# Patient Record
Sex: Male | Born: 1980
Health system: Southern US, Community
[De-identification: ages and names within clinical notes are randomized; demographics above are authoritative.]

## PROBLEM LIST (undated history)

## (undated) DIAGNOSIS — Z6838 Body mass index (BMI) 38.0-38.9, adult: Secondary | ICD-10-CM

## (undated) DIAGNOSIS — I69354 Hemiplegia and hemiparesis following cerebral infarction affecting left non-dominant side: Secondary | ICD-10-CM

## (undated) DIAGNOSIS — M1990 Unspecified osteoarthritis, unspecified site: Secondary | ICD-10-CM

## (undated) DIAGNOSIS — G4733 Obstructive sleep apnea (adult) (pediatric): Secondary | ICD-10-CM

## (undated) DIAGNOSIS — R3915 Urgency of urination: Secondary | ICD-10-CM

## (undated) DIAGNOSIS — G8194 Hemiplegia, unspecified affecting left nondominant side: Secondary | ICD-10-CM

## (undated) DIAGNOSIS — I6930 Unspecified sequelae of cerebral infarction: Secondary | ICD-10-CM

## (undated) DIAGNOSIS — I639 Cerebral infarction, unspecified: Secondary | ICD-10-CM

## (undated) DIAGNOSIS — Z8679 Personal history of other diseases of the circulatory system: Secondary | ICD-10-CM

## (undated) DIAGNOSIS — Z8669 Personal history of other diseases of the nervous system and sense organs: Secondary | ICD-10-CM

## (undated) DIAGNOSIS — I739 Peripheral vascular disease, unspecified: Secondary | ICD-10-CM

## (undated) HISTORY — PX: CIRCUMCISION: SUR203

## (undated) HISTORY — DX: Peripheral vascular disease, unspecified: I73.9

---

## 2014-01-06 ENCOUNTER — Emergency Department (INDEPENDENT_AMBULATORY_CARE_PROVIDER_SITE_OTHER)
Admission: EM | Admit: 2014-01-06 | Discharge: 2014-01-06 | Disposition: A | Discharge status: Home / Self Care | Payer: Medicaid Other | Source: Home / Self Care | Attending: Family Medicine | Admitting: Family Medicine

## 2014-01-06 DIAGNOSIS — K5289 Other specified noninfective gastroenteritis and colitis: Secondary | ICD-10-CM

## 2014-01-06 DIAGNOSIS — K529 Noninfective gastroenteritis and colitis, unspecified: Secondary | ICD-10-CM

## 2014-01-06 MED ORDER — CIPROFLOXACIN HCL 500 MG PO TABS: 500.0000 mg | ORAL_TABLET | Freq: Two times a day (BID) | ORAL | Status: DC

## 2014-01-06 NOTE — ED Notes (Signed)
C/o abd pain See physician note  

## 2014-01-06 NOTE — Discharge Instructions (Signed)
Thank you for coming in today. Take cipro twice daily for 3 days.  Come back as needed.  I will call you if the tests come back positive.  If your belly pain worsens, or you have high fever, bad vomiting, blood in your stool or black tarry stool go to the Emergency Room.   Diarrhea Diarrhea is frequent loose and watery bowel movements. It can cause you to feel weak and dehydrated. Dehydration can cause you to become tired and thirsty, have a dry mouth, and have decreased urination that often is dark yellow. Diarrhea is a sign of another problem, most often an infection that will not last long. In most cases, diarrhea typically lasts 2-3 days. However, it can last longer if it is a sign of something more serious. It is important to treat your diarrhea as directed by your caregiver to lessen or prevent future episodes of diarrhea. CAUSES  Some common causes include:  Gastrointestinal infections caused by viruses, bacteria, or parasites.  Food poisoning or food allergies.  Certain medicines, such as antibiotics, chemotherapy, and laxatives.  Artificial sweeteners and fructose.  Digestive disorders. HOME CARE INSTRUCTIONS  Ensure adequate fluid intake (hydration): Have 1 cup (8 oz) of fluid for each diarrhea episode. Avoid fluids that contain simple sugars or sports drinks, fruit juices, whole milk products, and sodas. Your urine should be clear or pale yellow if you are drinking enough fluids. Hydrate with an oral rehydration solution that you can purchase at pharmacies, retail stores, and online. You can prepare an oral rehydration solution at home by mixing the following ingredients together:   - tsp table salt.   tsp baking soda.   tsp salt substitute containing potassium chloride.  1  tablespoons sugar.  1 L (34 oz) of water.  Certain foods and beverages may increase the speed at which food moves through the gastrointestinal (GI) tract. These foods and beverages should be avoided  and include:  Caffeinated and alcoholic beverages.  High-fiber foods, such as raw fruits and vegetables, nuts, seeds, and whole grain breads and cereals.  Foods and beverages sweetened with sugar alcohols, such as xylitol, sorbitol, and mannitol.  Some foods may be well tolerated and may help thicken stool including:  Starchy foods, such as rice, toast, pasta, low-sugar cereal, oatmeal, grits, baked potatoes, crackers, and bagels.  Bananas.  Applesauce.  Add probiotic-rich foods to help increase healthy bacteria in the GI tract, such as yogurt and fermented milk products.  Wash your hands well after each diarrhea episode.  Only take over-the-counter or prescription medicines as directed by your caregiver.  Take a warm bath to relieve any burning or pain from frequent diarrhea episodes. SEEK IMMEDIATE MEDICAL CARE IF:   You are unable to keep fluids down.  You have persistent vomiting.  You have blood in your stool, or your stools are black and tarry.  You do not urinate in 6-8 hours, or there is only a small amount of very dark urine.  You have abdominal pain that increases or localizes.  You have weakness, dizziness, confusion, or light-headedness.  You have a severe headache.  Your diarrhea gets worse or does not get better.  You have a fever or persistent symptoms for more than 2-3 days.  You have a fever and your symptoms suddenly get worse. MAKE SURE YOU:   Understand these instructions.  Will watch your condition.  Will get help right away if you are not doing well or get worse. Document Released: 05/23/2002 Document  Revised: 10/17/2013 Document Reviewed: 02/08/2012 [facility] Patient Information 2015 [place], [place]. This information is not intended to replace advice given to you by your health care provider. Make sure you discuss any questions you have with your health care provider.

## 2014-01-06 NOTE — ED Provider Notes (Signed)
Travis Mullins is a 33 y.o. male who presents to Urgent Care today for diarrhea. Patient is a 40 history of diarrhea associated with mild abdominal pain. He initially had fevers and headaches and chills. Now his only remaining symptom is continued diarrhea. He denies any blood in the stool. He denies any vomiting. He notes that [place] to the onset of symptoms. He has tried Pepto-Bismol which helps some.  Medical history significant for carotid artery dissection with left hemiplegia resulting from a football injury in 2001 No past medical history on file. History  Substance Use Topics  . Smoking status: Not on file  . Smokeless tobacco: Not on file  . Alcohol Use: Not on file   ROS as above Medications: No current facility-administered medications for this encounter.   Current Outpatient Prescriptions  Medication Sig Dispense Refill  . ciprofloxacin (CIPRO) 500 MG tablet Take 1 tablet (500 mg total) by mouth 2 (two) times daily.  6 tablet  0    Exam:  BP 129/91  Pulse 98  Temp(Src) 99.7 F (37.6 C) (Oral)  Resp 16  SpO2 100% Gen: Well NAD HEENT: EOMI,  MMM Lungs: Normal work of breathing. CTABL Heart: RRR no MRG Abd: NABS, Soft. Nondistended, Nontender no rebound or guarding Exts: Brisk capillary refill, warm and well perfused.   No results found for this or any previous visit (from the past 24 hour(s)). No results found.  Assessment and Plan: 33 y.o. male with enteritis versus colitis. Stool culture and C. difficile PCR pending. Empiric treatment with Cipro. Will call patient with results.  Discussed warning signs or symptoms. Please see discharge instructions. Patient expresses understanding.   This note was created using Conservation officer, historic buildingsDragon voice recognition software. Any transcription errors are unintended.    Travis BongEvan S Doshie Maggi, MD 01/06/14 2023

## 2014-01-07 LAB — CLOSTRIDIUM DIFFICILE BY PCR: Toxigenic C. Difficile by PCR: NEGATIVE

## 2014-01-13 ENCOUNTER — Telehealth (HOSPITAL_COMMUNITY): Payer: Self-pay | Admitting: *Deleted

## 2014-01-13 LAB — STOOL CULTURE: Special Requests: NORMAL

## 2014-01-13 NOTE — ED Notes (Signed)
Stool culture: Campylobacter species.  [facility].  She already has a form.  I told her the treatment with Cipro by Dr. Denyse Amassorey and that I was going to f/u with the pt.  She will add that to pt.'s DHHS form.  Dr. Denyse Amassorey had already reviewed and said treatment with Cipro adequate.  I called pt. Pt. verified x 2 and given result.  Pt. said he is better and no more diarrhea. I told him the [facility] would be calling him next week. Vassie MoselleYork, Melissaann Dizdarevic M 01/13/2014

## 2014-08-08 ENCOUNTER — Encounter (HOSPITAL_COMMUNITY): Payer: Self-pay | Admitting: *Deleted

## 2014-08-08 ENCOUNTER — Emergency Department (HOSPITAL_COMMUNITY)
Admission: EM | Admit: 2014-08-08 | Discharge: 2014-08-08 | Disposition: A | Discharge status: Home / Self Care | Payer: Medicaid Other | Source: Home / Self Care | Attending: Family Medicine | Admitting: Family Medicine

## 2014-08-08 DIAGNOSIS — J069 Acute upper respiratory infection, unspecified: Secondary | ICD-10-CM

## 2014-08-08 NOTE — ED Provider Notes (Signed)
CSN: [id number]     Arrival date & time 08/08/14  1346 History   First MD Initiated Contact with Patient 08/08/14 1450     Chief Complaint  Patient presents with  . URI   (Consider location/radiation/quality/duration/timing/severity/associated sxs/prior Treatment) HPI Comments: 34 year old male complaining of cold symptoms.  Patient is a 34 y.o. male presenting with URI.  URI Presenting symptoms: congestion, cough and rhinorrhea   Presenting symptoms: no ear pain, no fatigue and no sore throat   Associated symptoms: no neck pain     History reviewed. No pertinent past medical history. History reviewed. No pertinent past surgical history. History reviewed. No pertinent family history. History  Substance Use Topics  . Smoking status: Not on file  . Smokeless tobacco: Not on file  . Alcohol Use: No    Review of Systems  Constitutional: Positive for activity change. Negative for diaphoresis and fatigue.       Questionable fever patient did not take his temperature.  HENT: Positive for congestion, postnasal drip and rhinorrhea. Negative for ear pain, facial swelling, sore throat and trouble swallowing.   Eyes: Negative for pain, discharge and redness.  Respiratory: Positive for cough. Negative for chest tightness and shortness of breath.   Cardiovascular: Negative.   Gastrointestinal: Negative.   Musculoskeletal: Negative.  Negative for neck pain and neck stiffness.  Neurological: Negative.     Allergies  Review of patient's allergies indicates no known allergies.  Home Medications   Prior to Admission medications   Medication Sig Start Date End Date Taking? Authorizing Provider  ciprofloxacin (CIPRO) 500 MG tablet Take 1 tablet (500 mg total) by mouth 2 (two) times daily. 01/06/14   Rodolph BongEvan S Corey, MD   BP 149/85 mmHg  Pulse 94  Temp(Src) 99.2 F (37.3 C) (Oral)  Resp 20  SpO2 100% Physical Exam  Constitutional: He is oriented to person, place, and time. He appears  well-developed and well-nourished. No distress.  HENT:  Bilateral TMs normal Unable to visualize the oropharynx due to patient's tongue retraction and an inability to position it for visualization.  Eyes: Conjunctivae and EOM are normal.  Neck: Normal range of motion. Neck supple.  Cardiovascular: Normal rate, regular rhythm and normal heart sounds.   Pulmonary/Chest: Effort normal and breath sounds normal. No respiratory distress. He has no wheezes. He has no rales.  Musculoskeletal: Normal range of motion. He exhibits no edema.  Lymphadenopathy:    He has no cervical adenopathy.  Neurological: He is alert and oriented to person, place, and time.  Skin: Skin is warm and dry. No rash noted.  Psychiatric: He has a normal mood and affect.  Nursing note and vitals reviewed.   ED Course  Procedures (including critical care time) Labs Review Labs Reviewed - No data to display  Imaging Review No results found.   MDM   1. URI (upper respiratory infection)    Nyquil, or for nondrowsy use Dayquil with either Zyrec, Claritin or Allegra Nasal saline spray Ibuprofen Lots of fluids     Hayden Rasmussenavid Jeanine Caven, NP 08/08/14 1505

## 2014-08-08 NOTE — ED Notes (Signed)
Pt     Reports   Symptoms       Of  Cold  Congested      With       Symptoms    X   3  Days              Pt  Reports  The  Symptoms   Are  Not  Being  releived  By otc  meds   -  Pt  Sitting upright on  The  Exam table      Speaking  In  Complete  Sentances

## 2014-08-08 NOTE — Discharge Instructions (Signed)
Upper Respiratory Infection, Adult Nyquil, or for nondrowsy use Dayquil with either Zyrec, Claritin or Allegra Nasal saline spray Ibuprofen Lots of fluids An upper respiratory infection (URI) is also sometimes known as the common cold. The upper respiratory tract includes the nose, sinuses, throat, trachea, and bronchi. Bronchi are the airways leading to the lungs. Most people improve within 1 week, but symptoms can last up to 2 weeks. A residual cough may last even longer.  CAUSES Many different viruses can infect the tissues lining the upper respiratory tract. The tissues become irritated and inflamed and often become very moist. Mucus production is also common. A cold is contagious. You can easily spread the virus to others by oral contact. This includes kissing, sharing a glass, coughing, or sneezing. Touching your mouth or nose and then touching a surface, which is then touched by another person, can also spread the virus. SYMPTOMS  Symptoms typically develop 1 to 3 days after you come in contact with a cold virus. Symptoms vary from person to person. They may include:  Runny nose.  Sneezing.  Nasal congestion.  Sinus irritation.  Sore throat.  Loss of voice (laryngitis).  Cough.  Fatigue.  Muscle aches.  Loss of appetite.  Headache.  Low-grade fever. DIAGNOSIS  You might diagnose your own cold based on familiar symptoms, since most people get a cold 2 to 3 times a year. Your caregiver can confirm this based on your exam. Most importantly, your caregiver can check that your symptoms are not due to another disease such as strep throat, sinusitis, pneumonia, asthma, or epiglottitis. Blood tests, throat tests, and X-rays are not necessary to diagnose a common cold, but they may sometimes be helpful in excluding other more serious diseases. Your caregiver will decide if any further tests are required. RISKS AND COMPLICATIONS  You may be at risk for a more severe case of the  common cold if you smoke cigarettes, have chronic heart disease (such as heart failure) or lung disease (such as asthma), or if you have a weakened immune system. The very young and very old are also at risk for more serious infections. Bacterial sinusitis, middle ear infections, and bacterial pneumonia can complicate the common cold. The common cold can worsen asthma and chronic obstructive pulmonary disease (COPD). Sometimes, these complications can require emergency medical care and may be life-threatening. PREVENTION  The best way to protect against getting a cold is to practice good hygiene. Avoid oral or hand contact with people with cold symptoms. Wash your hands often if contact occurs. There is no clear evidence that vitamin C, vitamin E, echinacea, or exercise reduces the chance of developing a cold. However, it is always recommended to get plenty of rest and practice good nutrition. TREATMENT  Treatment is directed at relieving symptoms. There is no cure. Antibiotics are not effective, because the infection is caused by a virus, not by bacteria. Treatment may include:  Increased fluid intake. Sports drinks offer valuable electrolytes, sugars, and fluids.  Breathing heated mist or steam (vaporizer or shower).  Eating chicken soup or other clear broths, and maintaining good nutrition.  Getting plenty of rest.  Using gargles or lozenges for comfort.  Controlling fevers with ibuprofen or acetaminophen as directed by your caregiver.  Increasing usage of your inhaler if you have asthma. Zinc gel and zinc lozenges, taken in the first 24 hours of the common cold, can shorten the duration and lessen the severity of symptoms. Pain medicines may help with fever, muscle  aches, and throat pain. A variety of non-prescription medicines are available to treat congestion and runny nose. Your caregiver can make recommendations and may suggest nasal or lung inhalers for other symptoms.  HOME CARE  INSTRUCTIONS   Only take over-the-counter or prescription medicines for pain, discomfort, or fever as directed by your caregiver.  Use a warm mist humidifier or inhale steam from a shower to increase air moisture. This may keep secretions moist and make it easier to breathe.  Drink enough water and fluids to keep your urine clear or pale yellow.  Rest as needed.  Return to work when your temperature has returned to normal or as your caregiver advises. You may need to stay home longer to avoid infecting others. You can also use a face mask and careful hand washing to prevent spread of the virus. SEEK MEDICAL CARE IF:   After the first few days, you feel you are getting worse rather than better.  You need your caregiver's advice about medicines to control symptoms.  You develop chills, worsening shortness of breath, or brown or red sputum. These may be signs of pneumonia.  You develop yellow or brown nasal discharge or pain in the face, especially when you bend forward. These may be signs of sinusitis.  You develop a fever, swollen neck glands, pain with swallowing, or white areas in the back of your throat. These may be signs of strep throat. SEEK IMMEDIATE MEDICAL CARE IF:   You have a fever.  You develop severe or persistent headache, ear pain, sinus pain, or chest pain.  You develop wheezing, a prolonged cough, cough up blood, or have a change in your usual mucus (if you have chronic lung disease).  You develop sore muscles or a stiff neck. Document Released: 11/26/2000 Document Revised: 08/25/2011 Document Reviewed: 09/07/2013 [facility] Patient Information 2015 [place], [place]. This information is not intended to replace advice given to you by your health care provider. Make sure you discuss any questions you have with your health care provider.

## 2015-05-12 ENCOUNTER — Emergency Department (HOSPITAL_COMMUNITY)
Admission: EM | Admit: 2015-05-12 | Discharge: 2015-05-12 | Disposition: A | Discharge status: Home / Self Care | Payer: PRIVATE HEALTH INSURANCE | Source: Home / Self Care

## 2015-05-12 ENCOUNTER — Encounter (HOSPITAL_COMMUNITY): Payer: Self-pay | Admitting: *Deleted

## 2015-05-12 DIAGNOSIS — J069 Acute upper respiratory infection, unspecified: Secondary | ICD-10-CM

## 2015-05-12 MED ORDER — AMOXICILLIN 500 MG PO CAPS: 500.0000 mg | ORAL_CAPSULE | Freq: Three times a day (TID) | ORAL | Status: DC

## 2015-05-12 NOTE — Discharge Instructions (Signed)
Upper Respiratory Infection, Adult Most upper respiratory infections (URIs) are a viral infection of the air passages leading to the lungs. A URI affects the nose, throat, and upper air passages. The most common type of URI is nasopharyngitis and is typically referred to as "the common cold." URIs run their course and usually go away on their own. Most of the time, a URI does not require medical attention, but sometimes a bacterial infection in the upper airways can follow a viral infection. This is called a secondary infection. Sinus and middle ear infections are common types of secondary upper respiratory infections. Bacterial pneumonia can also complicate a URI. A URI can worsen asthma and chronic obstructive pulmonary disease (COPD). Sometimes, these complications can require emergency medical care and may be life threatening.  CAUSES Almost all URIs are caused by viruses. A virus is a type of germ and can spread from one person to another.  RISKS FACTORS You may be at risk for a URI if:   You smoke.   You have chronic heart or lung disease.  You have a weakened defense (immune) system.   You are very young or very old.   You have nasal allergies or asthma.  You work in crowded or poorly ventilated areas.  You work in health care facilities or schools. SIGNS AND SYMPTOMS  Symptoms typically develop 2-3 days after you come in contact with a cold virus. Most viral URIs last 7-10 days. However, viral URIs from the influenza virus (flu virus) can last 14-18 days and are typically more severe. Symptoms may include:   Runny or stuffy (congested) nose.   Sneezing.   Cough.   Sore throat.   Headache.   Fatigue.   Fever.   Loss of appetite.   Pain in your forehead, behind your eyes, and over your cheekbones (sinus pain).  Muscle aches.  DIAGNOSIS  Your health care provider may diagnose a URI by:  Physical exam.  Tests to check that your symptoms are not due to  another condition such as:  Strep throat.  Sinusitis.  Pneumonia.  Asthma. TREATMENT  A URI goes away on its own with time. It cannot be cured with medicines, but medicines may be prescribed or recommended to relieve symptoms. Medicines may help:  Reduce your fever.  Reduce your cough.  Relieve nasal congestion. HOME CARE INSTRUCTIONS   Take medicines only as directed by your health care provider.   Gargle warm saltwater or take cough drops to comfort your throat as directed by your health care provider.  Use a warm mist humidifier or inhale steam from a shower to increase air moisture. This may make it easier to breathe.  Drink enough fluid to keep your urine clear or pale yellow.   Eat soups and other clear broths and maintain good nutrition.   Rest as needed.   Return to work when your temperature has returned to normal or as your health care provider advises. You may need to stay home longer to avoid infecting others. You can also use a face mask and careful hand washing to prevent spread of the virus.  Increase the usage of your inhaler if you have asthma.   Do not use any tobacco products, including cigarettes, chewing tobacco, or electronic cigarettes. If you need help quitting, ask your health care provider. PREVENTION  The best way to protect yourself from getting a cold is to practice good hygiene.   Avoid oral or hand contact with people with cold   symptoms.   Wash your hands often if contact occurs.  There is no clear evidence that vitamin C, vitamin E, echinacea, or exercise reduces the chance of developing a cold. However, it is always recommended to get plenty of rest, exercise, and practice good nutrition.  SEEK MEDICAL CARE IF:   You are getting worse rather than better.   Your symptoms are not controlled by medicine.   You have chills.  You have worsening shortness of breath.  You have brown or red mucus.  You have yellow or brown nasal  discharge.  You have pain in your face, especially when you bend forward.  You have a fever.  You have swollen neck glands.  You have pain while swallowing.  You have white areas in the back of your throat. SEEK IMMEDIATE MEDICAL CARE IF:   You have severe or persistent:  Headache.  Ear pain.  Sinus pain.  Chest pain.  You have chronic lung disease and any of the following:  Wheezing.  Prolonged cough.  Coughing up blood.  A change in your usual mucus.  You have a stiff neck.  You have changes in your:  Vision.  Hearing.  Thinking.  Mood. MAKE SURE YOU:   Understand these instructions.  Will watch your condition.  Will get help right away if you are not doing well or get worse.   This information is not intended to replace advice given to you by your health care provider. Make sure you discuss any questions you have with your health care provider.   Document Released: 11/26/2000 Document Revised: 10/17/2014 Document Reviewed: 09/07/2013 Elsevier Interactive Patient Education 2016 [employer] Inc[employer]  

## 2015-05-12 NOTE — ED Notes (Signed)
Pt  Reports  Symptoms  Of  Cough   comngestion   With  Symptoms   X   6  Days     Pt also  Has  Some  Body  Aches    As   Well      He is  Sitting  Upright on the  Exam table  Table  Speaking  In  Complete  sentances

## 2015-05-13 NOTE — ED Provider Notes (Signed)
CSN: [id number]     Arrival date & time 05/12/15  1412 History   None    Chief Complaint  Patient presents with  . URI   (Consider location/radiation/quality/duration/timing/severity/associated sxs/prior Treatment) HPI History obtained from patient:   LOCATION:chest SEVERITY: DURATION:6 days CONTEXT:sudden onset QUALITY: MODIFYING FACTORS:otc meds without relief ASSOCIATED SYMPTOMS:body aches TIMING:constant OCCUPATION:  History reviewed. No pertinent past medical history. History reviewed. No pertinent past surgical history. History reviewed. No pertinent family history. Social History  Substance Use Topics  . Smoking status: None  . Smokeless tobacco: None  . Alcohol Use: No    Review of Systems ROS +'vecough  Denies: HEADACHE, NAUSEA, ABDOMINAL PAIN, CHEST PAIN, CONGESTION, DYSURIA, SHORTNESS OF BREATH  Allergies  Review of patient's allergies indicates no known allergies.  Home Medications   Prior to Admission medications   Medication Sig Start Date End Date Taking? Authorizing Provider  amoxicillin (AMOXIL) 500 MG capsule Take 1 capsule (500 mg total) by mouth 3 (three) times daily. 05/12/15   Tharon AquasFrank C Lasheka Kempner, PA  ciprofloxacin (CIPRO) 500 MG tablet Take 1 tablet (500 mg total) by mouth 2 (two) times daily. 01/06/14   Rodolph BongEvan S Corey, MD   Meds Ordered and Administered this Visit  Medications - No data to display  BP 124/77 mmHg  Pulse 79  Temp(Src) 98.2 F (36.8 C) (Oral)  Resp 18  SpO2 98% No data found.   Physical Exam  Constitutional: He is oriented to person, place, and time. He appears well-developed and well-nourished.  HENT:  Head: Normocephalic and atraumatic.  Eyes: Conjunctivae are normal.  Neck: Neck supple.  Cardiovascular: Normal rate, regular rhythm and normal heart sounds.   Pulmonary/Chest: Effort normal and breath sounds normal.  Abdominal: Soft. Bowel sounds are normal.  Musculoskeletal: Normal range of motion.  Lymphadenopathy:     He has no cervical adenopathy.  Neurological: He is alert and oriented to person, place, and time.  Skin: Skin is warm and dry.  Psychiatric: He has a normal mood and affect. His behavior is normal. Judgment and thought content normal.  Nursing note and vitals reviewed.   ED Course  Procedures (including critical care time)  Labs Review Labs Reviewed - No data to display  Imaging Review No results found.   Visual Acuity Review  Right Eye Distance:   Left Eye Distance:   Bilateral Distance:    Right Eye Near:   Left Eye Near:    Bilateral Near:         MDM   1. Acute URI    Amoxil and follow up if there are new or worsening of symptoms    Tharon AquasFrank C Domonique Cothran, GeorgiaPA 05/13/15 1222

## 2015-07-14 ENCOUNTER — Encounter (HOSPITAL_COMMUNITY): Payer: Self-pay | Admitting: *Deleted

## 2015-07-14 ENCOUNTER — Emergency Department (HOSPITAL_COMMUNITY)
Admission: EM | Admit: 2015-07-14 | Discharge: 2015-07-14 | Disposition: A | Discharge status: Home / Self Care | Payer: PRIVATE HEALTH INSURANCE | Source: Home / Self Care | Attending: Family Medicine | Admitting: Family Medicine

## 2015-07-14 DIAGNOSIS — J069 Acute upper respiratory infection, unspecified: Secondary | ICD-10-CM

## 2015-07-14 HISTORY — DX: Hemiplegia, unspecified affecting left nondominant side: G81.94

## 2015-07-14 HISTORY — DX: Cerebral infarction, unspecified: I63.9

## 2015-07-14 NOTE — ED Provider Notes (Signed)
CSN: [id number]     Arrival date & time 07/14/15  1841 History   First MD Initiated Contact with Patient 07/14/15 2009     Chief Complaint  Patient presents with  . Cough  . Nasal Congestion   (Consider location/radiation/quality/duration/timing/severity/associated sxs/prior Treatment) HPI Comments: 35 year old obese male complaining of sniffles, cough, sneezing, runny nose, upper respiratory congestion, PND for 8 days. He has taken several OTC medications without relief. Denies fever or chills.  Patient is a 35 y.o. male presenting with cough.  Cough Associated symptoms: rhinorrhea   Associated symptoms: no chills, no diaphoresis, no ear pain, no eye discharge, no fever, no shortness of breath and no sore throat     Past Medical History  Diagnosis Date  . Stroke (HCC)     R/T football injury - 2001  . Left hemiparesis (HCC)    History reviewed. No pertinent past surgical history. No family history on file. Social History  Substance Use Topics  . Smoking status: Never Smoker   . Smokeless tobacco: None  . Alcohol Use: Yes     Comment: occasional    Review of Systems  Constitutional: Positive for activity change. Negative for fever, chills, diaphoresis and fatigue.  HENT: Positive for congestion, postnasal drip and rhinorrhea. Negative for ear pain, facial swelling, sore throat and trouble swallowing.   Eyes: Negative for pain, discharge and redness.  Respiratory: Positive for cough. Negative for chest tightness and shortness of breath.   Cardiovascular: Negative.   Gastrointestinal: Negative.   Musculoskeletal: Negative.  Negative for neck pain and neck stiffness.  Neurological: Negative.     Allergies  Review of patient's allergies indicates no known allergies.  Home Medications   Prior to Admission medications   Medication Sig Start Date End Date Taking? Authorizing Provider  amoxicillin (AMOXIL) 500 MG capsule Take 1 capsule (500 mg total) by mouth 3 (three)  times daily. 05/12/15   Tharon Aquas, PA  ciprofloxacin (CIPRO) 500 MG tablet Take 1 tablet (500 mg total) by mouth 2 (two) times daily. 01/06/14   Rodolph Bong, MD   Meds Ordered and Administered this Visit  Medications - No data to display  BP 155/101 mmHg  Pulse 98  Temp(Src) 98.5 F (36.9 C) (Oral)  Resp 20  SpO2 96% No data found.   Physical Exam  Constitutional: He is oriented to person, place, and time. He appears well-developed and well-nourished. No distress.  HENT:  Bilateral TMs are normal. Unable to visualize oropharynx due to large thick tongue and patient's retraction of tongue  Eyes: Conjunctivae and EOM are normal.  Neck: Normal range of motion. Neck supple.  Cardiovascular: Normal rate, regular rhythm and normal heart sounds.   Pulmonary/Chest: Effort normal and breath sounds normal. No respiratory distress. He has no wheezes.  Musculoskeletal: Normal range of motion. He exhibits no edema.  Lymphadenopathy:    He has no cervical adenopathy.  Neurological: He is alert and oriented to person, place, and time.  Skin: Skin is warm and dry. No rash noted.  Psychiatric: He has a normal mood and affect.  Nursing note and vitals reviewed.   ED Course  Procedures (including critical care time)  Labs Review Labs Reviewed - No data to display  Imaging Review No results found.   Visual Acuity Review  Right Eye Distance:   Left Eye Distance:   Bilateral Distance:    Right Eye Near:   Left Eye Near:    Bilateral Near:  MDM   1. URI (upper respiratory infection)    Upper Respiratory Infection, Adult For head congestion use Sudafed PE 10 mg every 4 hours Saline nasal spray frequently For drainage use Claritin, Allegra or Zyrtec. If needed may use Chlor-Trimeton 4 mg every 4 hours for something stronger. This may cause drowsiness. Drink plenty of fluids stay well-hydrated Humidifier.    Hayden Rasmussen, NP 07/14/15 2036

## 2015-07-14 NOTE — Discharge Instructions (Signed)
Upper Respiratory Infection, Adult For head congestion use Sudafed PE 10 mg every 4 hours Saline nasal spray frequently For drainage use Claritin, Allegra or Zyrtec. If needed may use Chlor-Trimeton 4 mg every 4 hours for something stronger. This may cause drowsiness. Drink plenty of fluids stay well-hydrated Humidifier. Most upper respiratory infections (URIs) are a viral infection of the air passages leading to the lungs. A URI affects the nose, throat, and upper air passages. The most common type of URI is nasopharyngitis and is typically referred to as "the common cold." URIs run their course and usually go away on their own. Most of the time, a URI does not require medical attention, but sometimes a bacterial infection in the upper airways can follow a viral infection. This is called a secondary infection. Sinus and middle ear infections are common types of secondary upper respiratory infections. Bacterial pneumonia can also complicate a URI. A URI can worsen asthma and chronic obstructive pulmonary disease (COPD). Sometimes, these complications can require emergency medical care and may be life threatening.  CAUSES Almost all URIs are caused by viruses. A virus is a type of germ and can spread from one person to another.  RISKS FACTORS You may be at risk for a URI if:   You smoke.   You have chronic heart or lung disease.  You have a weakened defense (immune) system.   You are very young or very old.   You have nasal allergies or asthma.  You work in crowded or poorly ventilated areas.  You work in health care facilities or schools. SIGNS AND SYMPTOMS  Symptoms typically develop 2-3 days after you come in contact with a cold virus. Most viral URIs last 7-10 days. However, viral URIs from the influenza virus (flu virus) can last 14-18 days and are typically more severe. Symptoms may include:   Runny or stuffy (congested) nose.   Sneezing.   Cough.   Sore throat.    Headache.   Fatigue.   Fever.   Loss of appetite.   Pain in your forehead, behind your eyes, and over your cheekbones (sinus pain).  Muscle aches.  DIAGNOSIS  Your health care provider may diagnose a URI by:  Physical exam.  Tests to check that your symptoms are not due to another condition such as:  Strep throat.  Sinusitis.  Pneumonia.  Asthma. TREATMENT  A URI goes away on its own with time. It cannot be cured with medicines, but medicines may be prescribed or recommended to relieve symptoms. Medicines may help:  Reduce your fever.  Reduce your cough.  Relieve nasal congestion. HOME CARE INSTRUCTIONS   Take medicines only as directed by your health care provider.   Gargle warm saltwater or take cough drops to comfort your throat as directed by your health care provider.  Use a warm mist humidifier or inhale steam from a shower to increase air moisture. This may make it easier to breathe.  Drink enough fluid to keep your urine clear or pale yellow.   Eat soups and other clear broths and maintain good nutrition.   Rest as needed.   Return to work when your temperature has returned to normal or as your health care provider advises. You may need to stay home longer to avoid infecting others. You can also use a face mask and careful hand washing to prevent spread of the virus.  Increase the usage of your inhaler if you have asthma.   Do not use any tobacco  products, including cigarettes, chewing tobacco, or electronic cigarettes. If you need help quitting, ask your health care provider. PREVENTION  The best way to protect yourself from getting a cold is to practice good hygiene.   Avoid oral or hand contact with people with cold symptoms.   Wash your hands often if contact occurs.  There is no clear evidence that vitamin C, vitamin E, echinacea, or exercise reduces the chance of developing a cold. However, it is always recommended to get plenty  of rest, exercise, and practice good nutrition.  SEEK MEDICAL CARE IF:   You are getting worse rather than better.   Your symptoms are not controlled by medicine.   You have chills.  You have worsening shortness of breath.  You have brown or red mucus.  You have yellow or brown nasal discharge.  You have pain in your face, especially when you bend forward.  You have a fever.  You have swollen neck glands.  You have pain while swallowing.  You have white areas in the back of your throat. SEEK IMMEDIATE MEDICAL CARE IF:   You have severe or persistent:  Headache.  Ear pain.  Sinus pain.  Chest pain.  You have chronic lung disease and any of the following:  Wheezing.  Prolonged cough.  Coughing up blood.  A change in your usual mucus.  You have a stiff neck.  You have changes in your:  Vision.  Hearing.  Thinking.  Mood. MAKE SURE YOU:   Understand these instructions.  Will watch your condition.  Will get help right away if you are not doing well or get worse.   This information is not intended to replace advice given to you by your health care provider. Make sure you discuss any questions you have with your health care provider.   Document Released: 11/26/2000 Document Revised: 10/17/2014 Document Reviewed: 09/07/2013 Elsevier Interactive Patient Education [employer]

## 2015-07-14 NOTE — ED Notes (Signed)
C/O sneezing, productive cough, runny nose, nasal congestion x 8 days.  Has been taking Nyquil, Mucinex, chlorpheniramine without any relief.

## 2016-04-24 DIAGNOSIS — N50811 Right testicular pain: Secondary | ICD-10-CM | POA: Diagnosis not present

## 2016-05-21 DIAGNOSIS — G8194 Hemiplegia, unspecified affecting left nondominant side: Secondary | ICD-10-CM | POA: Diagnosis not present

## 2016-05-21 DIAGNOSIS — L608 Other nail disorders: Secondary | ICD-10-CM | POA: Diagnosis not present

## 2016-05-21 DIAGNOSIS — M24572 Contracture, left ankle: Secondary | ICD-10-CM | POA: Diagnosis not present

## 2016-05-21 DIAGNOSIS — B351 Tinea unguium: Secondary | ICD-10-CM | POA: Diagnosis not present

## 2016-05-21 DIAGNOSIS — M61272 Paralytic calcification and ossification of muscle, left ankle and foot: Secondary | ICD-10-CM | POA: Diagnosis not present

## 2016-06-23 DIAGNOSIS — M61272 Paralytic calcification and ossification of muscle, left ankle and foot: Secondary | ICD-10-CM | POA: Diagnosis not present

## 2016-06-27 DIAGNOSIS — N451 Epididymitis: Secondary | ICD-10-CM | POA: Diagnosis not present

## 2016-06-27 DIAGNOSIS — N50811 Right testicular pain: Secondary | ICD-10-CM | POA: Diagnosis not present

## 2016-07-15 DIAGNOSIS — N50811 Right testicular pain: Secondary | ICD-10-CM | POA: Diagnosis not present

## 2016-08-18 DIAGNOSIS — N50811 Right testicular pain: Secondary | ICD-10-CM | POA: Diagnosis not present

## 2016-09-26 DIAGNOSIS — E785 Hyperlipidemia, unspecified: Secondary | ICD-10-CM | POA: Diagnosis not present

## 2016-09-26 DIAGNOSIS — Z0000 Encounter for general adult medical examination without abnormal findings: Secondary | ICD-10-CM | POA: Diagnosis not present

## 2016-10-03 DIAGNOSIS — I639 Cerebral infarction, unspecified: Secondary | ICD-10-CM | POA: Diagnosis not present

## 2016-10-03 DIAGNOSIS — G4733 Obstructive sleep apnea (adult) (pediatric): Secondary | ICD-10-CM | POA: Diagnosis not present

## 2016-10-03 DIAGNOSIS — M21372 Foot drop, left foot: Secondary | ICD-10-CM | POA: Diagnosis not present

## 2016-10-03 DIAGNOSIS — Z0001 Encounter for general adult medical examination with abnormal findings: Secondary | ICD-10-CM | POA: Diagnosis not present

## 2016-10-08 DIAGNOSIS — L659 Nonscarring hair loss, unspecified: Secondary | ICD-10-CM | POA: Diagnosis not present

## 2016-11-16 ENCOUNTER — Encounter: Payer: Self-pay | Admitting: Neurology

## 2016-11-17 ENCOUNTER — Encounter (INDEPENDENT_AMBULATORY_CARE_PROVIDER_SITE_OTHER): Payer: Self-pay

## 2016-11-17 ENCOUNTER — Encounter: Payer: Self-pay | Admitting: Neurology

## 2016-11-17 ENCOUNTER — Ambulatory Visit (INDEPENDENT_AMBULATORY_CARE_PROVIDER_SITE_OTHER): Payer: Federal, State, Local not specified - PPO | Admitting: Neurology

## 2016-11-17 ENCOUNTER — Telehealth: Payer: Self-pay

## 2016-11-17 DIAGNOSIS — Z9989 Dependence on other enabling machines and devices: Secondary | ICD-10-CM | POA: Diagnosis not present

## 2016-11-17 DIAGNOSIS — E662 Morbid (severe) obesity with alveolar hypoventilation: Secondary | ICD-10-CM

## 2016-11-17 DIAGNOSIS — Z6841 Body Mass Index (BMI) 40.0 and over, adult: Secondary | ICD-10-CM

## 2016-11-17 DIAGNOSIS — Z8679 Personal history of other diseases of the circulatory system: Secondary | ICD-10-CM | POA: Diagnosis not present

## 2016-11-17 DIAGNOSIS — I69359 Hemiplegia and hemiparesis following cerebral infarction affecting unspecified side: Secondary | ICD-10-CM | POA: Diagnosis not present

## 2016-11-17 DIAGNOSIS — G4733 Obstructive sleep apnea (adult) (pediatric): Secondary | ICD-10-CM

## 2016-11-17 MED ORDER — BACLOFEN 10 MG PO TABS: 10.0000 mg | ORAL_TABLET | Freq: Every day | ORAL | 1 refills | Status: DC

## 2016-11-17 NOTE — Progress Notes (Addendum)
SLEEP MEDICINE CLINIC   Provider:  Melvyn Novas, M D  Primary Care Physician:  Pearson Grippe, MD   Referring Provider: Pearson Grippe, MD   Chief Complaint  Patient presents with  . New Patient (Initial Visit)    had sleep study 10 years ago, on cpap, needs supplies, needs DME    HPI:  Travis Mullins is a 35 y.o. male , seen here as in a referral from Dr. Selena Batten for a transfer of sleep care- patient has OSA and is on CPAP,  Travis Mullins is seen here today as a new patient. He reports having been diagnosed with obstructive sleep apnea in [facility]. He recalls being told that his apnea was moderately severe and he was placed on CPAP. He was diagnosed in 2008. By 2014 he had moved to [facility] and had no sleep follow-up care. His machine however is a newer model and yet is his first CPAP machine he ever owned. It is a ResMed S 9.He has used a nasal pillow since beginning CPAP therapy. I do not have access to his original sleep study-  but since it is almost 10 years ago I will repeat a split-night polysomnography, if his insurance permits. He is willing to give a release of medical information request for the [facility] sleep lab.   Chief complaint according to patient : none- the CPAP machine still seems to work, but he needs urgently new supplies.   Sleep habits are as follows: The patient's bedtime is between 10 and 11 PM, he describes his bedroom is cool, quiet and dark. He does not have preferred sleep position. He snores loudest when on his back and tries to avoid supine sleep. He will wake up with choking sensations short of air. He ends up on his back. He does report some orthopnea, feeling more at ease when seated which makes it easier to breathe. He has found himself sleeping in a seated position. Even with his residual left hemiparesis he can sleep on the left side without problem. While on CPAP he can sleep through the night, does not have nocturia and wakes up at 5 AM, usually rises  in the morning refreshed at about 5:15 AM. He estimates only 5-6 hours of sleep. He takes naps in daytime, lasting hours on week ends.   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 He has had no other symptoms of TIA, has recovered very well. He has a mild residual left-sided weakness is also affected the muscle mass his left arm is smaller than his right. He does have mild spasticity 2. He is right-hand dominant.  His BMI is 43 but blood pressure and heart rate for normal limits, Dr. Selena Batten ordered a comprehensive metabolic panel a lipid panel and a CBC.   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 He commutes to [employer], does not use alcohol, he has a very limited caffeine intake as well, does not exceed 2-3 caffeinated beverages a week. 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 He is not a shift worker per se. He works 8 AM to 4:30 PM at the [employer]. The patient lives alone.  Review of Systems: Out of a complete 14 system review, the  patient complains of only the following symptoms, and all other reviewed systems are negative. The patient often reports daytime fatigue and tiredness, in spite of being a compliant CPAP user. He knows that he does not snore while on CPAP.  Epworth score 12 , Fatigue severity score 59  , depression score n/a   Social History   Social History  . Marital status: Single    Spouse name: N/A  . Number of children: N/A  . Years of education: N/A   Occupational History  . Not on file.   Social History Main Topics  . Smoking status: Never Smoker  . Smokeless tobacco: Never Used  . Alcohol use Yes     Comment: occasional  . Drug use: No  . Sexual activity: Not on file    Other Topics Concern  . Not on file   Social History Narrative  . No narrative on file    Family History  Problem Relation Age of Onset  . Healthy Mother   . Healthy Father   . Lung cancer Maternal Grandfather     Past Medical History:  Diagnosis Date  . Left hemiparesis (HCC)   . Right-sided carotid artery disease (HCC)   . Stroke [facility])    R/T football injury - 2001    No past surgical history on file.  Current Outpatient Prescriptions  Medication Sig Dispense Refill  . amoxicillin (AMOXIL) 500 MG capsule Take 1 capsule (500 mg total) by mouth 3 (three) times daily. 21 capsule 0  . ciprofloxacin (CIPRO) 500 MG tablet Take 1 tablet (500 mg total) by mouth 2 (two) times daily. 6 tablet 0  . finasteride (PROSCAR) 5 MG tablet Take 0.25 tablets by mouth daily.    . Fluocinolone Acetonide Scalp 0.01 % OIL     . methocarbamol (ROBAXIN) 500 MG tablet Take 500 mg by mouth every 6 (six) hours as needed for muscle spasms.    . Omega-3 Fatty Acids (FISH OIL) 1000 MG CAPS Take 1,000 mg by mouth daily.    . simvastatin (ZOCOR) 10 MG tablet Take 10 mg by mouth daily.     No current facility-administered medications for this visit.     Allergies as of 11/17/2016  . (No Known Allergies)    Vitals: BP 129/84   Pulse 76   Resp 20   Ht 6' (1.829 m)   Wt (!) 310 lb (140.6 kg)   BMI 42.04 kg/m  Last Weight:  Wt Readings from Last 1 Encounters:  11/17/16 (!) 310 lb (140.6 kg)   WUJ:WJXB mass index is 42.04 kg/m.     Last Height:   Ht Readings from Last 1 Encounters:  11/17/16 6' (1.829 m)    Physical exam:  General: The patient is awake, alert and appears not in acute distress. The patient is well groomed. Head: Normocephalic, atraumatic. Neck is supple. Mallampati 5 - large tongue.  neck circumference: 18.5 . Nasal airflow patent , TMJ is not evident . Retrognathia is seen.  Cardiovascular:  Regular rate and rhythm , without  murmurs or carotid bruit, and without  distended neck veins. Respiratory: Lungs are clear to auscultation. Skin:  Without evidence of edema, or rash Trunk: BMI is 42 . The patient's posture is erect   Neurologic exam : The patient is awake and alert, oriented to place and time.  Attention span & concentration ability appears normal.   Cranial nerves: Pupils are equal and briskly reactive to light. Extraocular movements  in vertical and  horizontal planes intact and without nystagmus. Visual fields by finger perimetry are intact. Hearing to finger rub intact.  Facial sensation intact to fine touch. Facial motor strength is symmetric and tongue and uvula move midline. Shoulder shrug was symmetrical.   Motor exam:  Throughout the left body there is an elevation of tone, there has also been atrophy of some of the left-sided muscles, his left shoulder is lower, his biceps is smaller and his wrist and finger movements are more limited with spasticity. He has some hamstring weakness and some limited dorsiflexion of the left foot- high  Tone. He has had Botox hand treatments.   Sensory:  Fine touch, pinprick and vibration were tested in all extremities.  Proprioception tested in the upper extremities was normal.  Coordination: Rapid alternating movements in the fingers/hands was normal. Finger-to-nose maneuver  normal without evidence of ataxia, dysmetria or tremor. Gait and station: Patient walks without assistive device and is able unassisted to climb up to the exam table. Limp on the left  Turns with 3  Steps. Romberg testing is negative. Deep tendon reflexes: left sided brisk, spasticity.  Assessment:  After physical and neurologic examination, review of laboratory studies,  Personal review of imaging studies, reports of other /same  Imaging studies, results of polysomnography and / or neurophysiology testing and pre-existing records as far as provided in visit., my assessment is   1) I strongly suppose that Mr. Lonni Fixolan sleep apnea is  still present, but I would like to confirm the degree of apnea as well as the character of apnea. 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 They can also be an impact on his soft palate musculature and for this reason it would be helpful to undergo an attended sleep study that will tell us in which sleep position apnea is present. Until then I will ask him to use his current CPAP. I will be happy to prescribe an interface that he can use from now until his results are back. He used an AIRFIT P 10 with medium pillows.   2) hemi-spasticity, hemiparesis on the left.More concerned about his hand tone because he seems not to fully extend his fingers, and I am worried that he has limited movement of the thumb with contracture formation. I will refer him to my colleague Dr. Lucia GaskinsAhern for Botox evaluation. hsi limited use of the left hand makes CPAP cleaning and packing more difficult. He may need a CPAP cleaning device.  The patient reports that baclofen helped him at night, he could not tolerate it well in daytime due to drowsiness. Baclofen has not been used for the last 10 years, but other muscle relaxants have not given him relief from spasticity at night. We discussed in detail that baclofen at 10 mg taken at night should be safe as long as he is using a CPAP. My main concern was that it could increase obstructive sleep apnea and may be acting as a respiratory suppression agent.  3) at this time the patient presents with a body mass index over 40 and would be considered morbidly obese. Be happy to provide him some nutritional guidelines. He may also benefit from structured exercises. It has been very expensive for him to go through physical therapy so it may be easier to join the [employer] rarely and a Systems analystpersonal trainer  to establish a new  routine. We should consider the new CPAP machine to be auto titrator capably as his BMI may change significantly. He has not entertained surgical treatment options      The patient was advised of the nature of the OSA, hemispasticity, we discussed  Muscle relaxant therapy, Botox  -Therapy and nutritional counseling, CPAP use and cleaning to treat OSA  , the treatment options and the  risks for general health and wellness arising from not treating the condition.   I spent more than 45 minutes of face to face time with the patient.  Greater than 50% of time was spent in counseling and coordination of care. We have discussed the diagnosis and differential and I answered the patient's questions.    Plan:  Treatment plan and additional workup :  I will order a split night polysomnography with diagnostic and therapeutic part incorporated. My goal for the patient is to be re-evaluated for the degree of apnea, as his body mass index and body spasticity have changed over the last 10 years. He also has a very high degree Mallampati, large neck circumference and is considered morbidly obese all of these contribute to the risk factors of having obstructive sleep apnea and possibly at a higher degree than 10 years ago.  The patient reports having sleep related headache when he sleeps with CPAP; this is an indication for hypoventilation. I will make his CPAP titration a priority.  I thank Dr. Selena Batten for allowing me to participate in this pleasant gentleman's care. We will meet after the sleep study to discuss therapy. Usually a CPAP will be prescribed as soon as the results of a positive sleep study are in. I would choose a CPAP but can be used as an Production assistant, radio for this patient, who is now working more consistently to lose weight. As his BMI will change, his pressure needs will also.   Melvyn Novas, MD 11/17/2016, 11:53 AM  Certified in Neurology by ABPN Certified in Sleep Medicine by [facility]  [facility] [address] [place], [place] [address]

## 2016-11-17 NOTE — Addendum Note (Signed)
Addended by: Melvyn NovasHMEIER, Nakota Elsen on: 11/17/2016 12:29 PM   Modules accepted: Orders

## 2016-11-17 NOTE — Addendum Note (Signed)
Addended by: Melvyn NovasHMEIER, Nadra Hritz on: 11/17/2016 12:42 PM   Modules accepted: Orders

## 2016-11-17 NOTE — Patient Instructions (Addendum)
Sleep Studies A sleep study (polysomnogram) is a series of tests done while you are sleeping. It can show how well you sleep. This can help your health care provider diagnose a sleep disorder and show how severe your sleep disorder is. A sleep study may lead to treatment that will help you sleep better and prevent other medical problems caused by poor sleep. If you have a sleep disorder, you may also be at risk for:  Sleep-related accidents.  High blood pressure.  Heart disease.  Stroke.  Other medical conditions.  Sleep disorders are common. Your health care provider may suspect a sleep disorder if you:  Have loud snoring most nights.  Have brief periods when you stop breathing at night.  Feel sleepy on most days.  Fall asleep suddenly during the day.  Have trouble falling asleep or staying asleep.  Feel like you need to move your legs when trying to fall asleep.  Have dreams that seem very real shortly after falling asleep.  Feel like you cannot move when you first wake up.  Which tests will I need to have? Most sleep studies last all night and include these tests:  Recordings of your brain activity.  Recordings of your eye movements.  Recording of your heart rate and rhythm.  Blood pressure readings.  Readings of the amount of oxygen in your blood.  Measurements of your chest and belly movement as you breathe during sleep.  If you have signs of the sleep disorder called sleep apnea during your test, you may get a mask to wear for the second half of the night.  The mask provides continuous positive airway pressure (CPAP). This may improve sleep apnea significantly.  You will then have all tests done again with the mask in place to see if your measurements and recordings change.  How are sleep studies done? Most sleep studies are done over one full night of sleep.  You will arrive at the study center in the evening and can go home in the morning.  Bring your  pajamas and toothbrush.  Do not have caffeine on the day of your sleep study.  Your health care provider will let you know if you need to stop taking any of your regular medicines before the test.  To do the tests included in a polysomnogram, you will have:  Round, sticky patches with sensors attached to recording wires (electrodes) placed on your scalp, face, chest, and limbs.  Wires from all the electrodes and sensors run from your bed to a computer. The wires can be taken off and put back on if you need to get out of bed to go to the bathroom.  A sensor placed over your nose to measure airflow.  A finger clip put on one finger to measure your blood oxygen level.  A belt around your belly and a belt around your chest to measure breathing movements.  Where are sleep studies done? Sleep studies are done at sleep centers. A sleep center may be inside a hospital, office, or clinic. The room where you have the study may look like a hospital room or a hotel room. The health care providers doing the study may come in and out of the room during the study. Most of the time, they will be in another room monitoring your test. How is information from sleep studies helpful? A polysomnogram can be used along with your medical history and a physical exam to diagnose conditions, such as:  Sleep apnea.    Restless legs syndrome.  Sleep-related seizure disorders.  Sleep-related movement disorders.  A medical doctor who specializes in sleep will evaluate your sleep study. The specialist will share the results with your primary health care provider. Treatments based on your sleep study may include:  Improving your sleep habits (sleep hygiene).  Wearing a CPAP mask.  Wearing an oral device at night to improve breathing and reduce snoring.  Taking medicine for: ? Restless legs syndrome. ? Sleep-related seizure disorder. ? Sleep-related movement disorder.  This information is not intended to  replace advice given to you by your health care provider. Make sure you discuss any questions you have with your health care provider. Document Released: 12/07/2002 Document Revised: 01/27/2016 Document Reviewed: 08/08/2013 Elsevier Interactive Patient Education  2018 Elsevier Inc. Baclofen tablets What is this medicine? BACLOFEN (BAK loe fen) helps relieve spasms and cramping of muscles. It may be used to treat symptoms of multiple sclerosis or spinal cord injury. This medicine may be used for other purposes; ask your health care provider or pharmacist if you have questions. COMMON BRAND NAME(S): ED Baclofen, Lioresal What should I tell my health care provider before I take this medicine? They need to know if you have any of these conditions: -kidney disease -seizures -stroke -an unusual or allergic reaction to baclofen, other medicines, foods, dyes, or preservatives -pregnant or trying to get pregnant -breast-feeding How should I use this medicine? Take this medicine by mouth. Swallow it with a drink of water. Follow the directions on the prescription label. Do not take more medicine than you are told to take. Talk to your pediatrician regarding the use of this medicine in children. Special care may be needed. Overdosage: If you think you have taken too much of this medicine contact a poison control center or emergency room at once. NOTE: This medicine is only for you. Do not share this medicine with others. What if I miss a dose? If you miss a dose, take it as soon as you can. If it is almost time for your next dose, take only that dose. Do not take double or extra doses. What may interact with this medicine? Do not take this medication with any of the following medicines: -narcotic medicines for cough This medicine may also interact with the following medications: -alcohol -antihistamines for allergy, cough and cold -certain medicines for anxiety or sleep -certain medicines for  depression like amitriptyline, fluoxetine, sertraline -certain medicines for seizures like phenobarbital, primidone -general anesthetics like halothane, isoflurane, methoxyflurane, propofol -local anesthetics like lidocaine, pramoxine, tetracaine -medicines that relax muscles for surgery -narcotic medicines for pain -phenothiazines like chlorpromazine, mesoridazine, prochlorperazine, thioridazine This list may not describe all possible interactions. Give your health care provider a list of all the medicines, herbs, non-prescription drugs, or dietary supplements you use. Also tell them if you smoke, drink alcohol, or use illegal drugs. Some items may interact with your medicine. What should I watch for while using this medicine? Tell your doctor or health care professional if your symptoms do not start to get better or if they get worse. Do not suddenly stop taking your medicine. If you do, you may develop a severe reaction. If your doctor wants you to stop the medicine, the dose will be slowly lowered over time to avoid any side effects. Follow the advice of your doctor. You may get drowsy or dizzy. Do not drive, use machinery, or do anything that needs mental alertness until you know how this medicine affects you. Do not  stand or sit up quickly, especially if you are an older patient. This reduces the risk of dizzy or fainting spells. Alcohol may interfere with the effect of this medicine. Avoid alcoholic drinks. If you are taking another medicine that also causes drowsiness, you may have more side effects. Give your health care provider a list of all medicines you use. Your doctor will tell you how much medicine to take. Do not take more medicine than directed. Call emergency for help if you have problems breathing or unusual sleepiness. What side effects may I notice from receiving this medicine? Side effects that you should report to your doctor or health care professional as soon as  possible: -allergic reactions like skin rash, itching or hives, swelling of the face, lips, or tongue -breathing problems -changes in emotions or moods -changes in vision -chest pain -fast, irregular heartbeat -feeling faint or lightheaded, falls -hallucinations -loss of balance or coordination -ringing of the ears -seizures -trouble passing urine or change in the amount of urine -trouble walking -unusually weak or tired Side effects that usually do not require medical attention (report to your doctor or health care professional if they continue or are bothersome): -changes in taste -confusion -constipation -diarrhea -dry mouth -headache -muscle weakness -nausea, vomiting -trouble sleeping This list may not describe all possible side effects. Call your doctor for medical advice about side effects. You may report side effects to FDA at 1-800-FDA-1088. Where should I keep my medicine? Keep out of the reach of children. Store at room temperature between 15 and 30 degrees C (59 and 86 degrees F). Keep container tightly closed. Throw away any unused medicine after the expiration date. NOTE: This sheet is a summary. It may not cover all possible information. If you have questions about this medicine, talk to your doctor, pharmacist, or health care provider.  2018 Elsevier/Gold Standard (2015-03-12 15:56:23)

## 2016-11-17 NOTE — Telephone Encounter (Signed)
Since we do not have a sleep study on this pt available, I am not sure that a DME can offer pt supplies for his cpap. I have reached out to [facility] to find out.

## 2016-12-02 NOTE — Telephone Encounter (Signed)
I called pt to discuss. Pt has not scheduled his sleep study. Pt will need to complete this before he can get cpap supplies.  No answer, left a message asking him to call me back.

## 2016-12-02 NOTE — Telephone Encounter (Signed)
Received this notice from [facility]:  "I can not bill his insurance without a Sleep Study.  We may be able to provide him with some "DEMO" supplies while we wait for the Sleep study."

## 2016-12-08 NOTE — Telephone Encounter (Signed)
I called pt. I advised him that without a sleep study, we cannot get him new cpap supplies. Pt says that he will proceed with his sleep study as scheduled for Thursday. Pt verbalized understanding.

## 2016-12-11 ENCOUNTER — Ambulatory Visit (INDEPENDENT_AMBULATORY_CARE_PROVIDER_SITE_OTHER): Payer: Federal, State, Local not specified - PPO | Admitting: Neurology

## 2016-12-11 DIAGNOSIS — Z6841 Body Mass Index (BMI) 40.0 and over, adult: Secondary | ICD-10-CM

## 2016-12-11 DIAGNOSIS — G4733 Obstructive sleep apnea (adult) (pediatric): Secondary | ICD-10-CM | POA: Diagnosis not present

## 2016-12-11 DIAGNOSIS — I69359 Hemiplegia and hemiparesis following cerebral infarction affecting unspecified side: Secondary | ICD-10-CM

## 2016-12-11 DIAGNOSIS — Z9989 Dependence on other enabling machines and devices: Principal | ICD-10-CM

## 2016-12-11 DIAGNOSIS — E662 Morbid (severe) obesity with alveolar hypoventilation: Secondary | ICD-10-CM

## 2016-12-11 DIAGNOSIS — Z8679 Personal history of other diseases of the circulatory system: Secondary | ICD-10-CM

## 2016-12-12 DIAGNOSIS — L648 Other androgenic alopecia: Secondary | ICD-10-CM | POA: Diagnosis not present

## 2016-12-26 NOTE — Procedures (Signed)
PATIENT'S NAME:  Travis Mullins, Travis Mullins DOB:      December 06, 1980      MR#:    [id number]     DATE OF RECORDING: 12/11/2016 REFERRING M.D.:  Pearson Grippe Study Performed:  Split-Night Titration Study HISTORY:  Mr. Travis Mullins is a 36 y.o. male, seen here as in a referral from Dr. Selena Batten for a transfer of sleep care- patient has established diagnosis of OSA and is on CPAP since being diagnosed with obstructive sleep apnea in [place], [place] in 2008. He recalls being told that his apnea was moderately severe, he uses a  ResMed S 9.He has used a nasal pillow since beginning CPAP therapy. I do not have access to his original sleep study- but since it is almost 10 years ago I repeat a split-night polysomnography.  The patient endorsed the Epworth Sleepiness Scale at 12 points   The patient's weight 310 pounds with a height of 72 (inches), resulting in a BMI of 42.1 kg/m2. The patient's neck circumference measured 18.5 inches.  CURRENT MEDICATIONS: Amoxil, Cipro, Proscar, Robaxin, Fish Oil, Zocor   PROCEDURE:  This is a multichannel digital polysomnogram utilizing the SomnoStar 11.2 system.  Electrodes and sensors were applied and monitored per AASM Specifications.   EEG, EOG, Chin and Limb EMG, were sampled at 200 Hz.  ECG, Snore and Nasal Pressure, Thermal Airflow, Respiratory Effort, CPAP Flow and Pressure, Oximetry was sampled at 50 Hz. Digital video and audio were recorded.      BASELINE STUDY WITHOUT CPAP RESULTS:  Lights Out was at 21:07 and Lights On at 05:07.  Total recording time (TRT) was 253.5, with a total sleep time (TST) of 125.5 minutes.   The patient's sleep latency was 122.5 minutes.  REM latency was 71 minutes.  The sleep efficiency was 49.5 %.    SLEEP ARCHITECTURE: WASO (Wake after sleep onset) was 2 minutes, Stage N1 was 4 minutes, Stage N2 was 79.5 minutes, Stage N3 was 27.5 minutes and Stage R (REM sleep) was 14.5 minutes.  The percentages were Stage N1 3.2%, Stage N2 63.3%, Stage N3 21.9% and  Stage R (REM sleep) 11.6%.   RESPIRATORY ANALYSIS:  There were a total of 111 respiratory events:  32 obstructive apneas, 0 central apneas and 0 mixed apneas with a total of 32 apneas and an apnea index (AI) of 15.3. There were 79 hypopneas with a hypopnea index of 37.8. The patient also had 0 respiratory event related arousals (RERAs).  Snoring was noted.   The total APNEA/HYPOPNEA INDEX (AHI) was 53.1 /hour and the total RESPIRATORY DISTURBANCE INDEX was 53.1 /hour.  14 events occurred in REM sleep and 158 events in NREM. The REM AHI was 57.9, /hour versus a non-REM AHI of 52.4 /hour. The patient spent 271 minutes sleep time in the supine position 77 minutes in non-supine. The supine AHI was 65.3 /hour versus a non-supine AHI of 0.0 /hour.  OXYGEN SATURATION & C02:  The wake baseline 02 saturation was 98%, with the lowest being 82%. Time spent below 89% saturation equaled 3 minutes.  PERIODIC LIMB MOVEMENTS:   The patient had a total of 30 Periodic Limb Movements. The arousals were noted as: 12 were spontaneous, 6 were associated with PLMs, and 109 were associated with respiratory events. The Periodic Limb Movement (PLM) index was 14.3 /hour and the PLM Arousal index was 2.9 /hour Audio and video analysis did not show any abnormal or unusual movements, behaviors, phonations or vocalizations Snoring was noted EKG was in keeping  with normal sinus rhythm (NSR)  TITRATION STUDY WITH CPAP RESULTS:   CPAP was initiated at 6 cmH20 with heated humidity per AASM split night standards and pressure was advanced to 6/6 cmH20 because of hypopneas, apneas and desaturations.  At a PAP pressure of 6 cmH20, there was a reduction of the AHI to 0.0 /hour and SpO2 Nadir to 94%.  Total recording time (TRT) was 227 minutes, with a total sleep time (TST) of 222 minutes. The patient's sleep latency was 1.5 minutes. REM latency was 5 minutes.  The sleep efficiency was 97.8 %.    SLEEP ARCHITECTURE: Wake after sleep was  3.5 minutes, Stage N1 3.5 minutes, Stage N2 89 minutes, Stage N3 65 minutes and Stage R (REM sleep) 64.5 minutes. The percentages were: Stage N1 1.6%, Stage N2 40.1%, Stage N3 29.3% and Stage R (REM sleep) 29.1%.  The arousals were noted as: 21 were spontaneous, 0 were associated with PLMs, and 0 were associated with respiratory events.  RESPIRATORY ANALYSIS:  There were a total of 0 respiratory events. The patient also had 0 respiratory event related arousals (RERAs).    The total APNEA/HYPOPNEA INDEX (AHI) was 0.0 /hour .The patient spent 76% of total sleep time in the supine position. The supine AHI was 0.0 /hour, versus a non-supine AHI of 0.0/hour. PERIODIC LIMB MOVEMENTS:   The patient had a total of 0 Periodic Limb Movements.   OXYGEN SATURATION & C02:  The wake baseline 02 saturation was 96%, with the lowest being 94%. Time spent below 89% saturation equaled 0 minutes.  POLYSOMNOGRAPHY IMPRESSION :   1. Severe Obstructive Sleep Apnea(OSA)   RECOMMENDATIONS: This patient responded well to only 6 cm water CPAP and reached an AHI of 0.0, sleep efficiency of 98%. An Airfit P 10 with medium pillows was used.   I certify that I have reviewed the entire raw data recording prior to the issuance of this report in accordance with the Standards of Accreditation of the American Academy of Sleep Medicine (AASM)   Melvyn Novasarmen Troyce Gieske, M.D.  12-23-2016 Diplomat, American Board of Psychiatry and Neurology  Diplomat, American Board of Sleep Medicine Medical Director of  [facility]

## 2016-12-26 NOTE — Addendum Note (Signed)
Addended by: Melvyn NovasHMEIER, Rhylan Gross on: 12/26/2016 12:01 PM   Modules accepted: Orders

## 2016-12-29 ENCOUNTER — Telehealth: Payer: Self-pay | Admitting: Neurology

## 2016-12-29 NOTE — Telephone Encounter (Signed)
I called pt. I advised pt that Dr. Vickey Hugerohmeier reviewed their sleep study titration results and found that pt  Responded well to the CPAP at 6 cm water. Dr. Vickey Hugerohmeier recommends that pt does use CPAP at night. I reviewed PAP compliance expectations with the pt. Pt is agreeable to starting a CPAP. I advised pt that an order will be sent to a DME, [facility], and [facility] will call the pt within about one week after they file with the pt's insurance. [facility] will show the pt how to use the machine, fit for masks, and troubleshoot the CPAP if needed. A follow up appt was made for insurance purposes with Dr. Marylou Flesherohmier on Monday Apr 13 2017 at 3:30pm. Pt verbalized understanding to arrive 15 minutes early and bring their CPAP. A letter with all of this information in it will be mailed to the pt as a reminder. I verified with the pt that the address we have on file is correct. Pt verbalized understanding of results. Pt had no questions at this time but was encouraged to call back if questions arise.

## 2017-02-26 DIAGNOSIS — G4733 Obstructive sleep apnea (adult) (pediatric): Secondary | ICD-10-CM | POA: Diagnosis not present

## 2017-03-28 DIAGNOSIS — G4733 Obstructive sleep apnea (adult) (pediatric): Secondary | ICD-10-CM | POA: Diagnosis not present

## 2017-04-13 ENCOUNTER — Telehealth: Payer: Self-pay | Admitting: Neurology

## 2017-04-13 ENCOUNTER — Ambulatory Visit: Payer: Self-pay | Admitting: Neurology

## 2017-04-13 NOTE — Telephone Encounter (Signed)
Pt no showed to apt today 

## 2017-04-14 ENCOUNTER — Encounter: Payer: Self-pay | Admitting: Neurology

## 2017-04-28 DIAGNOSIS — G4733 Obstructive sleep apnea (adult) (pediatric): Secondary | ICD-10-CM | POA: Diagnosis not present

## 2017-05-28 DIAGNOSIS — G4733 Obstructive sleep apnea (adult) (pediatric): Secondary | ICD-10-CM | POA: Diagnosis not present

## 2017-06-15 DIAGNOSIS — E785 Hyperlipidemia, unspecified: Secondary | ICD-10-CM | POA: Diagnosis not present

## 2017-06-15 DIAGNOSIS — I639 Cerebral infarction, unspecified: Secondary | ICD-10-CM | POA: Diagnosis not present

## 2017-07-09 DIAGNOSIS — N451 Epididymitis: Secondary | ICD-10-CM | POA: Diagnosis not present

## 2017-07-09 DIAGNOSIS — I7771 Dissection of carotid artery: Secondary | ICD-10-CM | POA: Diagnosis not present

## 2017-07-09 DIAGNOSIS — I639 Cerebral infarction, unspecified: Secondary | ICD-10-CM | POA: Diagnosis not present

## 2017-07-16 DIAGNOSIS — Z8679 Personal history of other diseases of the circulatory system: Secondary | ICD-10-CM | POA: Diagnosis not present

## 2017-07-16 DIAGNOSIS — Z8673 Personal history of transient ischemic attack (TIA), and cerebral infarction without residual deficits: Secondary | ICD-10-CM | POA: Diagnosis not present

## 2017-07-16 DIAGNOSIS — I639 Cerebral infarction, unspecified: Secondary | ICD-10-CM | POA: Diagnosis not present

## 2017-07-20 DIAGNOSIS — G4733 Obstructive sleep apnea (adult) (pediatric): Secondary | ICD-10-CM | POA: Diagnosis not present

## 2017-08-03 DIAGNOSIS — M659 Synovitis and tenosynovitis, unspecified: Secondary | ICD-10-CM | POA: Diagnosis not present

## 2017-08-03 DIAGNOSIS — M76822 Posterior tibial tendinitis, left leg: Secondary | ICD-10-CM | POA: Diagnosis not present

## 2017-08-03 DIAGNOSIS — M7752 Other enthesopathy of left foot: Secondary | ICD-10-CM | POA: Diagnosis not present

## 2017-08-03 DIAGNOSIS — M19072 Primary osteoarthritis, left ankle and foot: Secondary | ICD-10-CM | POA: Diagnosis not present

## 2017-08-10 DIAGNOSIS — M7752 Other enthesopathy of left foot: Secondary | ICD-10-CM | POA: Diagnosis not present

## 2017-08-10 DIAGNOSIS — M76821 Posterior tibial tendinitis, right leg: Secondary | ICD-10-CM | POA: Diagnosis not present

## 2017-08-10 DIAGNOSIS — M76822 Posterior tibial tendinitis, left leg: Secondary | ICD-10-CM | POA: Diagnosis not present

## 2017-08-24 DIAGNOSIS — M659 Synovitis and tenosynovitis, unspecified: Secondary | ICD-10-CM | POA: Diagnosis not present

## 2017-08-24 DIAGNOSIS — M7752 Other enthesopathy of left foot: Secondary | ICD-10-CM | POA: Diagnosis not present

## 2017-08-27 DIAGNOSIS — N3281 Overactive bladder: Secondary | ICD-10-CM | POA: Diagnosis not present

## 2017-08-27 DIAGNOSIS — M5412 Radiculopathy, cervical region: Secondary | ICD-10-CM | POA: Diagnosis not present

## 2017-08-27 DIAGNOSIS — G4733 Obstructive sleep apnea (adult) (pediatric): Secondary | ICD-10-CM | POA: Diagnosis not present

## 2017-08-27 DIAGNOSIS — R252 Cramp and spasm: Secondary | ICD-10-CM | POA: Diagnosis not present

## 2017-08-27 DIAGNOSIS — G5603 Carpal tunnel syndrome, bilateral upper limbs: Secondary | ICD-10-CM | POA: Diagnosis not present

## 2017-08-27 DIAGNOSIS — I639 Cerebral infarction, unspecified: Secondary | ICD-10-CM | POA: Diagnosis not present

## 2017-09-10 DIAGNOSIS — Z9989 Dependence on other enabling machines and devices: Secondary | ICD-10-CM | POA: Diagnosis not present

## 2017-09-10 DIAGNOSIS — G4733 Obstructive sleep apnea (adult) (pediatric): Secondary | ICD-10-CM | POA: Diagnosis not present

## 2017-09-25 DIAGNOSIS — L600 Ingrowing nail: Secondary | ICD-10-CM | POA: Diagnosis not present

## 2017-10-20 DIAGNOSIS — M2042 Other hammer toe(s) (acquired), left foot: Secondary | ICD-10-CM | POA: Diagnosis not present

## 2017-10-27 DIAGNOSIS — M2042 Other hammer toe(s) (acquired), left foot: Secondary | ICD-10-CM | POA: Diagnosis not present

## 2017-12-28 DIAGNOSIS — M542 Cervicalgia: Secondary | ICD-10-CM | POA: Diagnosis not present

## 2017-12-29 ENCOUNTER — Ambulatory Visit
Admission: RE | Admit: 2017-12-29 | Discharge: 2017-12-29 | Disposition: A | Discharge status: Home / Self Care | Payer: Federal, State, Local not specified - PPO | Source: Ambulatory Visit | Attending: Internal Medicine | Admitting: Internal Medicine

## 2017-12-29 ENCOUNTER — Other Ambulatory Visit: Payer: Self-pay | Admitting: Internal Medicine

## 2017-12-29 DIAGNOSIS — M542 Cervicalgia: Secondary | ICD-10-CM

## 2017-12-29 DIAGNOSIS — I6521 Occlusion and stenosis of right carotid artery: Secondary | ICD-10-CM | POA: Diagnosis not present

## 2017-12-29 MED ORDER — IOPAMIDOL (ISOVUE-370) INJECTION 76%
75.0000 mL | Freq: Once | INTRAVENOUS | Status: AC | PRN
  Administered 2017-12-29: 75 mL via INTRAVENOUS

## 2018-01-25 ENCOUNTER — Other Ambulatory Visit: Payer: Self-pay

## 2018-01-25 ENCOUNTER — Ambulatory Visit: Payer: Federal, State, Local not specified - PPO | Admitting: Surgery

## 2018-01-25 ENCOUNTER — Encounter: Payer: Self-pay | Admitting: Surgery

## 2018-01-25 VITALS — BP 136/89 | HR 98 | Resp 20 | Ht 72.0 in | Wt 331.4 lb

## 2018-01-25 DIAGNOSIS — M2042 Other hammer toe(s) (acquired), left foot: Secondary | ICD-10-CM | POA: Diagnosis not present

## 2018-01-25 DIAGNOSIS — Z8679 Personal history of other diseases of the circulatory system: Secondary | ICD-10-CM | POA: Diagnosis not present

## 2018-01-25 DIAGNOSIS — L600 Ingrowing nail: Secondary | ICD-10-CM | POA: Diagnosis not present

## 2018-01-25 NOTE — Progress Notes (Signed)
[facility]  Patient name: Travis Mullins MRN: [id number] DOB: 03/10/1981 Sex: male   REQUESTING PROVIDER:    Dr. Selena BattenKim   REASON FOR CONSULT:    Carotid dissection  HISTORY OF PRESENT ILLNESS:   Travis Mullins is a 37 y.o. male, who is referred today for evaluation of a chronic right carotid dissection.  Approximately 15 years ago the patient was playing middle linebacker in college football and suffered a freak head injury which caused a right carotid dissection and subsequent occlusion of his right carotid artery.  He developed left-sided hemiparesis.  Recently he has been having some discomfort in the right neck which led to a CT scan.  He is here today for further follow-up.  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  He is a non-smoker.  PAST MEDICAL HISTORY    Past Medical History:  Diagnosis Date  . Left hemiparesis (HCC)   . Right-sided carotid artery disease (HCC)   . Stroke [facility](HCC)    R/T football injury - 2001     FAMILY HISTORY   Family History  Problem Relation Age of Onset  . Healthy Mother   . Healthy Father   . Lung cancer Maternal Grandfather     SOCIAL HISTORY:   Social History   Socioeconomic History  . Marital status: Single    Spouse name: Not on file  . Number of children: Not on file  . Years of education: Not on file  . Highest education level: Not on file  Occupational History  . Not on file  Social Needs  . Financial resource strain: Not on file  . Food insecurity:    Worry: Not on file    Inability: Not on file  . Transportation needs:    Medical: Not on file    Non-medical: Not on file  Tobacco Use  . Smoking status: Never Smoker  . Smokeless tobacco: Never Used  Substance and Sexual Activity  . Alcohol use: Yes    Comment: occasional  . Drug use: No  . Sexual activity: Not on file  Lifestyle  . Physical activity:    Days per week: Not on file   Minutes per session: Not on file  . Stress: Not on file  Relationships  . Social connections:    Talks on phone: Not on file    Gets together: Not on file    Attends religious service: Not on file    Active member of club or organization: Not on file    Attends meetings of clubs or organizations: Not on file    Relationship status: Not on file  . Intimate partner violence:    Fear of current or ex partner: Not on file    Emotionally abused: Not on file    Physically abused: Not on file    Forced sexual activity: Not on file  Other Topics Concern  . Not on file  Social History Narrative  . Not on file    ALLERGIES:    No Known Allergies  CURRENT MEDICATIONS:    Current Outpatient Medications  Medication Sig Dispense Refill  . baclofen (LIORESAL) 10 MG tablet Take 1 tablet (10 mg total) by mouth at bedtime. 90 each 1  . Fluocinolone Acetonide Scalp 0.01 % OIL     . methocarbamol (ROBAXIN) 500 MG tablet Take 500 mg by mouth every 6 (six) hours as needed for muscle spasms.    Marland Kitchen. amoxicillin (AMOXIL) 500 MG  capsule Take 1 capsule (500 mg total) by mouth 3 (three) times daily. (Patient not taking: Reported on 01/25/2018) 21 capsule 0  . ciprofloxacin (CIPRO) 500 MG tablet Take 1 tablet (500 mg total) by mouth 2 (two) times daily. (Patient not taking: Reported on 01/25/2018) 6 tablet 0  . finasteride (PROSCAR) 5 MG tablet Take 0.25 tablets by mouth daily.    . Omega-3 Fatty Acids (FISH OIL) 1000 MG CAPS Take 1,000 mg by mouth daily.    . simvastatin (ZOCOR) 10 MG tablet Take 10 mg by mouth daily.     No current facility-administered medications for this visit.     REVIEW OF SYSTEMS:   [X]  denotes positive finding, [ ]  denotes negative finding Cardiac  Comments:  Chest pain or chest pressure:    Shortness of breath upon exertion:    Short of breath when lying flat:    Irregular heart rhythm:        Vascular    Pain in calf, thigh, or hip brought on by ambulation:    Pain in  feet at night that wakes you up from your sleep:     Blood clot in your veins:    Leg swelling:         Pulmonary    Oxygen at home:    Productive cough:     Wheezing:         Neurologic    Sudden weakness in arms or legs:  x   Sudden numbness in arms or legs:  x   Sudden onset of difficulty speaking or slurred speech:    Temporary loss of vision in one eye:     Problems with dizziness:         Gastrointestinal    Blood in stool:      Vomited blood:         Genitourinary    Burning when urinating:     Blood in urine:        Psychiatric    Major depression:         Hematologic    Bleeding problems:    Problems with blood clotting too easily:        Skin    Rashes or ulcers:        Constitutional    Fever or chills:     PHYSICAL EXAM:   Vitals:   01/25/18 1507  BP: 136/89  Pulse: 98  Resp: 20  SpO2: 96%  Weight: (!) 331 lb 6.4 oz (150.3 kg)  Height: 6' (1.829 m)    GENERAL: The patient is a well-nourished male, in no acute distress. The vital signs are documented above. CARDIAC: There is a regular rate and rhythm.  PULMONARY: Nonlabored respirations ABDOMEN: Soft and non-tender with normal pitched bowel sounds.  MUSCULOSKELETAL: There are no major deformities or cyanosis. NEUROLOGIC: Left-sided hemiparesis sKIN: There are no ulcers or rashes noted. PSYCHIATRIC: The patient has a normal affect.  STUDIES:   I have reviewed his CT angiogram of the head and neck with the following findings: Findings compatible with carotid dissection, likely chronic. There is smooth tapering of the right internal carotid artery with a tiny lumen patent to the skull base compatible with string sign.  Partial imaging of intracranial contents reveals chronic right MCA infarct with narrowing and irregularity of the right M1 segment and M2 segments.  I have reviewed the images with Dr. Grace IsaacWatts of interventional radiology.  From my interpretation this is a chronic problem  which  does appear to show a chronic occlusion.  ASSESSMENT and PLAN   Chronic right carotid dissection with occlusion of the right internal carotid artery at the skull base: I discussed with the patient that I feel this is a chronic problem with no new findings.  I would not recommend any changes to his medical regimen.  Although it is possible he could have a stump syndrome in the future, I feel that this is unlikely.  No immediate intervention is necessary.  He will follow-up on an as-needed basis.   Durene Cal, MD [facility] [phone] Pager [phone]

## 2018-02-23 DIAGNOSIS — R3915 Urgency of urination: Secondary | ICD-10-CM | POA: Diagnosis not present

## 2018-02-23 DIAGNOSIS — K921 Melena: Secondary | ICD-10-CM | POA: Diagnosis not present

## 2018-02-23 DIAGNOSIS — M542 Cervicalgia: Secondary | ICD-10-CM | POA: Diagnosis not present

## 2018-02-23 DIAGNOSIS — Z23 Encounter for immunization: Secondary | ICD-10-CM | POA: Diagnosis not present

## 2018-02-23 DIAGNOSIS — N528 Other male erectile dysfunction: Secondary | ICD-10-CM | POA: Diagnosis not present

## 2018-03-08 DIAGNOSIS — K625 Hemorrhage of anus and rectum: Secondary | ICD-10-CM | POA: Diagnosis not present

## 2018-03-08 DIAGNOSIS — K6289 Other specified diseases of anus and rectum: Secondary | ICD-10-CM | POA: Diagnosis not present

## 2018-03-15 ENCOUNTER — Ambulatory Visit (HOSPITAL_COMMUNITY)
Admission: EM | Admit: 2018-03-15 | Discharge: 2018-03-15 | Disposition: A | Discharge status: Home / Self Care | Payer: Federal, State, Local not specified - PPO | Attending: Family Medicine | Admitting: Family Medicine

## 2018-03-15 ENCOUNTER — Encounter (HOSPITAL_COMMUNITY): Payer: Self-pay | Admitting: Emergency Medicine

## 2018-03-15 DIAGNOSIS — H1131 Conjunctival hemorrhage, right eye: Secondary | ICD-10-CM | POA: Diagnosis not present

## 2018-03-15 NOTE — ED Provider Notes (Signed)
[facility]    CSN: [id number] Arrival date & time: 03/15/18  1945     History   Chief Complaint Chief Complaint  Patient presents with  . Eye Problem    HPI Travis Mullins is a 37 y.o. male. Pt woke this AM with red eye. Nonknown trauma, itch, etc  HPI  Past Medical History:  Diagnosis Date  . Left hemiparesis (HCC)   . Right-sided carotid artery disease (HCC)   . Stroke [facility])    R/T football injury - 2001    There are no active problems to display for this patient.   History reviewed. No pertinent surgical history.     Home Medications    Prior to Admission medications   Medication Sig Start Date End Date Taking? Authorizing Provider  amoxicillin (AMOXIL) 500 MG capsule Take 1 capsule (500 mg total) by mouth 3 (three) times daily. Patient not taking: Reported on 01/25/2018 05/12/15   Tharon Aquas, PA  baclofen (LIORESAL) 10 MG tablet Take 1 tablet (10 mg total) by mouth at bedtime. 11/17/16   Dohmeier, Porfirio Mylar, MD  ciprofloxacin (CIPRO) 500 MG tablet Take 1 tablet (500 mg total) by mouth 2 (two) times daily. Patient not taking: Reported on 01/25/2018 01/06/14   Rodolph Bong, MD  finasteride (PROSCAR) 5 MG tablet Take 0.25 tablets by mouth daily. 10/08/16   [provider]  Fluocinolone Acetonide Scalp 0.01 % OIL  10/08/16   [provider]  methocarbamol (ROBAXIN) 500 MG tablet Take 500 mg by mouth every 6 (six) hours as needed for muscle spasms.    [provider]  Omega-3 Fatty Acids (FISH OIL) 1000 MG CAPS Take 1,000 mg by mouth daily.    [provider]  simvastatin (ZOCOR) 10 MG tablet Take 10 mg by mouth daily.    [provider]    Family History Family History  Problem Relation Age of Onset  . Healthy Mother   . Healthy Father   . Lung cancer Maternal Grandfather     Social History Social History   Tobacco Use  . Smoking status: Never Smoker  . Smokeless tobacco: Never Used  Substance Use  Topics  . Alcohol use: Yes    Comment: occasional  . Drug use: No     Allergies   Patient has no known allergies.   Review of Systems Review of Systems  Constitutional: Negative for chills and fever.  HENT: Negative for ear pain and sore throat.   Eyes: Positive for redness. Negative for pain and visual disturbance.  Respiratory: Negative for cough and shortness of breath.   Cardiovascular: Negative for chest pain and palpitations.  Gastrointestinal: Negative for abdominal pain and vomiting.  Genitourinary: Negative for dysuria and hematuria.  Musculoskeletal: Negative for arthralgias and back pain.  Skin: Negative for color change and rash.  Neurological: Negative for seizures and syncope.  All other systems reviewed and are negative.    Physical Exam Triage Vital Signs ED Triage Vitals  Enc Vitals Group     BP 03/15/18 2005 132/85     Pulse Rate 03/15/18 2005 74     Resp 03/15/18 2005 16     Temp 03/15/18 2005 98.1 F (36.7 C)     Temp Source 03/15/18 2005 Oral     SpO2 03/15/18 2005 100 %     Weight --      Height --      Head Circumference --      Peak Flow --  Pain Score 03/15/18 2006 4     Pain Loc --      Pain Edu? --      Excl. in GC? --    No data found.  Updated Vital Signs BP 132/85   Pulse 74   Temp 98.1 F (36.7 C) (Oral)   Resp 16   SpO2 100%   Visual Acuity Right Eye Distance: 20/20 Left Eye Distance: 20/20 Bilateral Distance: 20/20  Right Eye Near:   Left Eye Near:    Bilateral Near:     R eye with redness inner , nasla portion c/w subconjunctival hemorrhage  UC Treatments / Results  Labs (all labs ordered are listed, but only abnormal results are displayed) Labs Reviewed - No data to display  EKG None  Radiology No results found.  Procedures Procedures (including critical care time)  Medications Ordered in UC Medications - No data to display  Initial Impression / Assessment and Plan / UC Course  I have reviewed  the triage vital signs and the nursing notes.  Pertinent labs & imaging results that were available during my care of the patient were reviewed by me and considered in my medical decision making (see chart for details).     R subconjunctival hemorrhage  Final Clinical Impressions(s) / UC Diagnoses   Final diagnoses:  None   Discharge Instructions   None    ED Prescriptions    None     Controlled Substance Prescriptions [place] Controlled Substance Registry consulted? No   Frederica Kuster, MD 03/15/18 2034

## 2018-03-15 NOTE — ED Triage Notes (Signed)
PT has redness in right eye that may be a burst blood vessel. No pain in eye. Slight headache on right side. No vision problems.   Previous CVA at 43 post football injury

## 2018-03-18 DIAGNOSIS — K921 Melena: Secondary | ICD-10-CM | POA: Diagnosis not present

## 2018-03-18 DIAGNOSIS — K6289 Other specified diseases of anus and rectum: Secondary | ICD-10-CM | POA: Diagnosis not present

## 2018-03-18 DIAGNOSIS — K625 Hemorrhage of anus and rectum: Secondary | ICD-10-CM | POA: Diagnosis not present

## 2018-04-13 DIAGNOSIS — I861 Scrotal varices: Secondary | ICD-10-CM | POA: Diagnosis not present

## 2018-04-13 DIAGNOSIS — N451 Epididymitis: Secondary | ICD-10-CM | POA: Diagnosis not present

## 2018-04-13 DIAGNOSIS — R3915 Urgency of urination: Secondary | ICD-10-CM | POA: Diagnosis not present

## 2018-04-20 DIAGNOSIS — G4733 Obstructive sleep apnea (adult) (pediatric): Secondary | ICD-10-CM | POA: Diagnosis not present

## 2018-05-17 DIAGNOSIS — L661 Lichen planopilaris: Secondary | ICD-10-CM | POA: Diagnosis not present

## 2018-05-17 DIAGNOSIS — L648 Other androgenic alopecia: Secondary | ICD-10-CM | POA: Diagnosis not present

## 2018-05-19 DIAGNOSIS — I639 Cerebral infarction, unspecified: Secondary | ICD-10-CM | POA: Diagnosis not present

## 2018-05-26 DIAGNOSIS — N528 Other male erectile dysfunction: Secondary | ICD-10-CM | POA: Diagnosis not present

## 2018-05-26 DIAGNOSIS — G4733 Obstructive sleep apnea (adult) (pediatric): Secondary | ICD-10-CM | POA: Diagnosis not present

## 2018-05-26 DIAGNOSIS — G8194 Hemiplegia, unspecified affecting left nondominant side: Secondary | ICD-10-CM | POA: Diagnosis not present

## 2018-05-26 DIAGNOSIS — I6930 Unspecified sequelae of cerebral infarction: Secondary | ICD-10-CM | POA: Diagnosis not present

## 2018-06-17 DIAGNOSIS — L668 Other cicatricial alopecia: Secondary | ICD-10-CM | POA: Diagnosis not present

## 2018-06-17 DIAGNOSIS — L661 Lichen planopilaris: Secondary | ICD-10-CM | POA: Diagnosis not present

## 2018-07-19 DIAGNOSIS — L661 Lichen planopilaris: Secondary | ICD-10-CM | POA: Diagnosis not present

## 2018-07-21 DIAGNOSIS — G4733 Obstructive sleep apnea (adult) (pediatric): Secondary | ICD-10-CM | POA: Diagnosis not present

## 2018-08-19 DIAGNOSIS — L661 Lichen planopilaris: Secondary | ICD-10-CM | POA: Diagnosis not present

## 2018-08-23 DIAGNOSIS — I639 Cerebral infarction, unspecified: Secondary | ICD-10-CM | POA: Diagnosis not present

## 2018-08-23 DIAGNOSIS — Z0000 Encounter for general adult medical examination without abnormal findings: Secondary | ICD-10-CM | POA: Diagnosis not present

## 2018-08-23 DIAGNOSIS — Z125 Encounter for screening for malignant neoplasm of prostate: Secondary | ICD-10-CM | POA: Diagnosis not present

## 2018-08-23 DIAGNOSIS — E785 Hyperlipidemia, unspecified: Secondary | ICD-10-CM | POA: Diagnosis not present

## 2018-08-30 DIAGNOSIS — Z0000 Encounter for general adult medical examination without abnormal findings: Secondary | ICD-10-CM | POA: Diagnosis not present

## 2018-12-01 DIAGNOSIS — G4733 Obstructive sleep apnea (adult) (pediatric): Secondary | ICD-10-CM | POA: Diagnosis not present

## 2018-12-06 DIAGNOSIS — R3915 Urgency of urination: Secondary | ICD-10-CM | POA: Diagnosis not present

## 2018-12-06 DIAGNOSIS — N451 Epididymitis: Secondary | ICD-10-CM | POA: Diagnosis not present

## 2018-12-06 DIAGNOSIS — F5221 Male erectile disorder: Secondary | ICD-10-CM | POA: Diagnosis not present

## 2018-12-06 DIAGNOSIS — I861 Scrotal varices: Secondary | ICD-10-CM | POA: Diagnosis not present

## 2018-12-07 DIAGNOSIS — L661 Lichen planopilaris: Secondary | ICD-10-CM | POA: Diagnosis not present

## 2018-12-22 DIAGNOSIS — Z3141 Encounter for fertility testing: Secondary | ICD-10-CM | POA: Diagnosis not present

## 2019-01-06 DIAGNOSIS — L661 Lichen planopilaris: Secondary | ICD-10-CM | POA: Diagnosis not present

## 2019-01-12 DIAGNOSIS — Z3141 Encounter for fertility testing: Secondary | ICD-10-CM | POA: Diagnosis not present

## 2019-01-14 DIAGNOSIS — Z7251 High risk heterosexual behavior: Secondary | ICD-10-CM | POA: Diagnosis not present

## 2019-02-01 DIAGNOSIS — L661 Lichen planopilaris: Secondary | ICD-10-CM | POA: Diagnosis not present

## 2019-02-01 DIAGNOSIS — L668 Other cicatricial alopecia: Secondary | ICD-10-CM | POA: Diagnosis not present

## 2019-03-04 DIAGNOSIS — G4733 Obstructive sleep apnea (adult) (pediatric): Secondary | ICD-10-CM | POA: Diagnosis not present

## 2019-03-11 DIAGNOSIS — B009 Herpesviral infection, unspecified: Secondary | ICD-10-CM | POA: Diagnosis not present

## 2019-03-12 DIAGNOSIS — R21 Rash and other nonspecific skin eruption: Secondary | ICD-10-CM | POA: Diagnosis not present

## 2019-03-16 DIAGNOSIS — R252 Cramp and spasm: Secondary | ICD-10-CM | POA: Diagnosis not present

## 2019-03-16 DIAGNOSIS — I6930 Unspecified sequelae of cerebral infarction: Secondary | ICD-10-CM | POA: Diagnosis not present

## 2019-03-16 DIAGNOSIS — N529 Male erectile dysfunction, unspecified: Secondary | ICD-10-CM | POA: Diagnosis not present

## 2019-03-16 DIAGNOSIS — G8194 Hemiplegia, unspecified affecting left nondominant side: Secondary | ICD-10-CM | POA: Diagnosis not present

## 2019-04-07 DIAGNOSIS — R21 Rash and other nonspecific skin eruption: Secondary | ICD-10-CM | POA: Diagnosis not present

## 2019-04-18 DIAGNOSIS — N471 Phimosis: Secondary | ICD-10-CM | POA: Diagnosis not present

## 2019-04-29 DIAGNOSIS — G4733 Obstructive sleep apnea (adult) (pediatric): Secondary | ICD-10-CM | POA: Diagnosis not present

## 2019-05-27 DIAGNOSIS — N471 Phimosis: Secondary | ICD-10-CM | POA: Diagnosis not present

## 2019-06-13 DIAGNOSIS — N471 Phimosis: Secondary | ICD-10-CM | POA: Diagnosis not present

## 2019-06-23 DIAGNOSIS — Z202 Contact with and (suspected) exposure to infections with a predominantly sexual mode of transmission: Secondary | ICD-10-CM | POA: Diagnosis not present

## 2019-07-06 DIAGNOSIS — R635 Abnormal weight gain: Secondary | ICD-10-CM | POA: Diagnosis not present

## 2019-07-14 DIAGNOSIS — Z79899 Other long term (current) drug therapy: Secondary | ICD-10-CM | POA: Diagnosis not present

## 2019-07-14 DIAGNOSIS — R635 Abnormal weight gain: Secondary | ICD-10-CM | POA: Diagnosis not present

## 2019-07-14 DIAGNOSIS — F54 Psychological and behavioral factors associated with disorders or diseases classified elsewhere: Secondary | ICD-10-CM | POA: Diagnosis not present

## 2019-07-14 DIAGNOSIS — G4733 Obstructive sleep apnea (adult) (pediatric): Secondary | ICD-10-CM | POA: Diagnosis not present

## 2019-07-14 DIAGNOSIS — Z6841 Body Mass Index (BMI) 40.0 and over, adult: Secondary | ICD-10-CM | POA: Diagnosis not present

## 2019-07-17 DIAGNOSIS — Z79899 Other long term (current) drug therapy: Secondary | ICD-10-CM | POA: Diagnosis not present

## 2019-07-25 DIAGNOSIS — R635 Abnormal weight gain: Secondary | ICD-10-CM | POA: Diagnosis not present

## 2019-07-25 DIAGNOSIS — E669 Obesity, unspecified: Secondary | ICD-10-CM | POA: Diagnosis not present

## 2019-07-25 DIAGNOSIS — K5900 Constipation, unspecified: Secondary | ICD-10-CM | POA: Diagnosis not present

## 2019-07-25 DIAGNOSIS — Z6841 Body Mass Index (BMI) 40.0 and over, adult: Secondary | ICD-10-CM | POA: Diagnosis not present

## 2019-07-25 DIAGNOSIS — G4733 Obstructive sleep apnea (adult) (pediatric): Secondary | ICD-10-CM | POA: Diagnosis not present

## 2019-07-26 DIAGNOSIS — Q558 Other specified congenital malformations of male genital organs: Secondary | ICD-10-CM | POA: Diagnosis not present

## 2019-08-01 DIAGNOSIS — G4733 Obstructive sleep apnea (adult) (pediatric): Secondary | ICD-10-CM | POA: Diagnosis not present

## 2019-08-08 DIAGNOSIS — F4323 Adjustment disorder with mixed anxiety and depressed mood: Secondary | ICD-10-CM | POA: Diagnosis not present

## 2019-08-10 ENCOUNTER — Other Ambulatory Visit: Payer: Self-pay | Admitting: Urology

## 2019-08-12 DIAGNOSIS — G4733 Obstructive sleep apnea (adult) (pediatric): Secondary | ICD-10-CM | POA: Diagnosis not present

## 2019-08-12 DIAGNOSIS — Z6839 Body mass index (BMI) 39.0-39.9, adult: Secondary | ICD-10-CM | POA: Diagnosis not present

## 2019-08-12 DIAGNOSIS — I6930 Unspecified sequelae of cerebral infarction: Secondary | ICD-10-CM | POA: Diagnosis not present

## 2019-08-12 DIAGNOSIS — E669 Obesity, unspecified: Secondary | ICD-10-CM | POA: Diagnosis not present

## 2019-08-15 DIAGNOSIS — F4323 Adjustment disorder with mixed anxiety and depressed mood: Secondary | ICD-10-CM | POA: Diagnosis not present

## 2019-08-16 DIAGNOSIS — Z6841 Body Mass Index (BMI) 40.0 and over, adult: Secondary | ICD-10-CM | POA: Diagnosis not present

## 2019-08-16 DIAGNOSIS — Z713 Dietary counseling and surveillance: Secondary | ICD-10-CM | POA: Diagnosis not present

## 2019-08-17 DIAGNOSIS — M545 Low back pain: Secondary | ICD-10-CM | POA: Diagnosis not present

## 2019-08-17 DIAGNOSIS — M9901 Segmental and somatic dysfunction of cervical region: Secondary | ICD-10-CM | POA: Diagnosis not present

## 2019-08-17 DIAGNOSIS — M62830 Muscle spasm of back: Secondary | ICD-10-CM | POA: Diagnosis not present

## 2019-08-17 DIAGNOSIS — M9903 Segmental and somatic dysfunction of lumbar region: Secondary | ICD-10-CM | POA: Diagnosis not present

## 2019-08-22 DIAGNOSIS — F4323 Adjustment disorder with mixed anxiety and depressed mood: Secondary | ICD-10-CM | POA: Diagnosis not present

## 2019-08-24 DIAGNOSIS — M545 Low back pain: Secondary | ICD-10-CM | POA: Diagnosis not present

## 2019-08-24 DIAGNOSIS — M9901 Segmental and somatic dysfunction of cervical region: Secondary | ICD-10-CM | POA: Diagnosis not present

## 2019-08-24 DIAGNOSIS — M9903 Segmental and somatic dysfunction of lumbar region: Secondary | ICD-10-CM | POA: Diagnosis not present

## 2019-08-24 DIAGNOSIS — M62830 Muscle spasm of back: Secondary | ICD-10-CM | POA: Diagnosis not present

## 2019-08-25 DIAGNOSIS — M545 Low back pain: Secondary | ICD-10-CM | POA: Diagnosis not present

## 2019-08-25 DIAGNOSIS — M9903 Segmental and somatic dysfunction of lumbar region: Secondary | ICD-10-CM | POA: Diagnosis not present

## 2019-08-25 DIAGNOSIS — M9901 Segmental and somatic dysfunction of cervical region: Secondary | ICD-10-CM | POA: Diagnosis not present

## 2019-08-25 DIAGNOSIS — M62830 Muscle spasm of back: Secondary | ICD-10-CM | POA: Diagnosis not present

## 2019-08-27 DIAGNOSIS — M545 Low back pain: Secondary | ICD-10-CM | POA: Diagnosis not present

## 2019-08-27 DIAGNOSIS — M9903 Segmental and somatic dysfunction of lumbar region: Secondary | ICD-10-CM | POA: Diagnosis not present

## 2019-08-27 DIAGNOSIS — M9901 Segmental and somatic dysfunction of cervical region: Secondary | ICD-10-CM | POA: Diagnosis not present

## 2019-08-27 DIAGNOSIS — M62830 Muscle spasm of back: Secondary | ICD-10-CM | POA: Diagnosis not present

## 2019-08-29 DIAGNOSIS — F4323 Adjustment disorder with mixed anxiety and depressed mood: Secondary | ICD-10-CM | POA: Diagnosis not present

## 2019-08-31 DIAGNOSIS — M62830 Muscle spasm of back: Secondary | ICD-10-CM | POA: Diagnosis not present

## 2019-08-31 DIAGNOSIS — M9901 Segmental and somatic dysfunction of cervical region: Secondary | ICD-10-CM | POA: Diagnosis not present

## 2019-08-31 DIAGNOSIS — M545 Low back pain: Secondary | ICD-10-CM | POA: Diagnosis not present

## 2019-08-31 DIAGNOSIS — M9903 Segmental and somatic dysfunction of lumbar region: Secondary | ICD-10-CM | POA: Diagnosis not present

## 2019-09-01 DIAGNOSIS — M9903 Segmental and somatic dysfunction of lumbar region: Secondary | ICD-10-CM | POA: Diagnosis not present

## 2019-09-01 DIAGNOSIS — M62830 Muscle spasm of back: Secondary | ICD-10-CM | POA: Diagnosis not present

## 2019-09-01 DIAGNOSIS — M545 Low back pain: Secondary | ICD-10-CM | POA: Diagnosis not present

## 2019-09-01 DIAGNOSIS — M9901 Segmental and somatic dysfunction of cervical region: Secondary | ICD-10-CM | POA: Diagnosis not present

## 2019-09-05 DIAGNOSIS — R739 Hyperglycemia, unspecified: Secondary | ICD-10-CM | POA: Diagnosis not present

## 2019-09-05 DIAGNOSIS — E785 Hyperlipidemia, unspecified: Secondary | ICD-10-CM | POA: Diagnosis not present

## 2019-09-05 DIAGNOSIS — Z125 Encounter for screening for malignant neoplasm of prostate: Secondary | ICD-10-CM | POA: Diagnosis not present

## 2019-09-05 DIAGNOSIS — Z0000 Encounter for general adult medical examination without abnormal findings: Secondary | ICD-10-CM | POA: Diagnosis not present

## 2019-09-05 DIAGNOSIS — Z713 Dietary counseling and surveillance: Secondary | ICD-10-CM | POA: Diagnosis not present

## 2019-09-05 DIAGNOSIS — E669 Obesity, unspecified: Secondary | ICD-10-CM | POA: Diagnosis not present

## 2019-09-07 DIAGNOSIS — Z6839 Body mass index (BMI) 39.0-39.9, adult: Secondary | ICD-10-CM | POA: Diagnosis not present

## 2019-09-07 DIAGNOSIS — Z713 Dietary counseling and surveillance: Secondary | ICD-10-CM | POA: Diagnosis not present

## 2019-09-07 DIAGNOSIS — F4323 Adjustment disorder with mixed anxiety and depressed mood: Secondary | ICD-10-CM | POA: Diagnosis not present

## 2019-09-07 DIAGNOSIS — I6930 Unspecified sequelae of cerebral infarction: Secondary | ICD-10-CM | POA: Diagnosis not present

## 2019-09-08 DIAGNOSIS — M62830 Muscle spasm of back: Secondary | ICD-10-CM | POA: Diagnosis not present

## 2019-09-08 DIAGNOSIS — M9901 Segmental and somatic dysfunction of cervical region: Secondary | ICD-10-CM | POA: Diagnosis not present

## 2019-09-08 DIAGNOSIS — M9903 Segmental and somatic dysfunction of lumbar region: Secondary | ICD-10-CM | POA: Diagnosis not present

## 2019-09-08 DIAGNOSIS — M545 Low back pain: Secondary | ICD-10-CM | POA: Diagnosis not present

## 2019-09-10 DIAGNOSIS — M9903 Segmental and somatic dysfunction of lumbar region: Secondary | ICD-10-CM | POA: Diagnosis not present

## 2019-09-10 DIAGNOSIS — M9901 Segmental and somatic dysfunction of cervical region: Secondary | ICD-10-CM | POA: Diagnosis not present

## 2019-09-10 DIAGNOSIS — M545 Low back pain: Secondary | ICD-10-CM | POA: Diagnosis not present

## 2019-09-10 DIAGNOSIS — M62830 Muscle spasm of back: Secondary | ICD-10-CM | POA: Diagnosis not present

## 2019-09-13 DIAGNOSIS — E669 Obesity, unspecified: Secondary | ICD-10-CM | POA: Diagnosis not present

## 2019-09-13 DIAGNOSIS — G4733 Obstructive sleep apnea (adult) (pediatric): Secondary | ICD-10-CM | POA: Diagnosis not present

## 2019-09-13 DIAGNOSIS — Z8673 Personal history of transient ischemic attack (TIA), and cerebral infarction without residual deficits: Secondary | ICD-10-CM | POA: Diagnosis not present

## 2019-09-14 DIAGNOSIS — R635 Abnormal weight gain: Secondary | ICD-10-CM | POA: Diagnosis not present

## 2019-09-14 DIAGNOSIS — M9901 Segmental and somatic dysfunction of cervical region: Secondary | ICD-10-CM | POA: Diagnosis not present

## 2019-09-14 DIAGNOSIS — Z0000 Encounter for general adult medical examination without abnormal findings: Secondary | ICD-10-CM | POA: Diagnosis not present

## 2019-09-14 DIAGNOSIS — M9903 Segmental and somatic dysfunction of lumbar region: Secondary | ICD-10-CM | POA: Diagnosis not present

## 2019-09-14 DIAGNOSIS — M62830 Muscle spasm of back: Secondary | ICD-10-CM | POA: Diagnosis not present

## 2019-09-14 DIAGNOSIS — M545 Low back pain: Secondary | ICD-10-CM | POA: Diagnosis not present

## 2019-09-15 DIAGNOSIS — M9901 Segmental and somatic dysfunction of cervical region: Secondary | ICD-10-CM | POA: Diagnosis not present

## 2019-09-15 DIAGNOSIS — M545 Low back pain: Secondary | ICD-10-CM | POA: Diagnosis not present

## 2019-09-15 DIAGNOSIS — M62830 Muscle spasm of back: Secondary | ICD-10-CM | POA: Diagnosis not present

## 2019-09-15 DIAGNOSIS — M9903 Segmental and somatic dysfunction of lumbar region: Secondary | ICD-10-CM | POA: Diagnosis not present

## 2019-09-21 DIAGNOSIS — M9903 Segmental and somatic dysfunction of lumbar region: Secondary | ICD-10-CM | POA: Diagnosis not present

## 2019-09-21 DIAGNOSIS — M62830 Muscle spasm of back: Secondary | ICD-10-CM | POA: Diagnosis not present

## 2019-09-21 DIAGNOSIS — M9901 Segmental and somatic dysfunction of cervical region: Secondary | ICD-10-CM | POA: Diagnosis not present

## 2019-09-21 DIAGNOSIS — M545 Low back pain: Secondary | ICD-10-CM | POA: Diagnosis not present

## 2019-09-22 ENCOUNTER — Other Ambulatory Visit: Payer: Self-pay

## 2019-09-22 ENCOUNTER — Encounter (HOSPITAL_BASED_OUTPATIENT_CLINIC_OR_DEPARTMENT_OTHER): Payer: Self-pay | Admitting: Urology

## 2019-09-22 DIAGNOSIS — M9901 Segmental and somatic dysfunction of cervical region: Secondary | ICD-10-CM | POA: Diagnosis not present

## 2019-09-22 DIAGNOSIS — M545 Low back pain: Secondary | ICD-10-CM | POA: Diagnosis not present

## 2019-09-22 DIAGNOSIS — M9903 Segmental and somatic dysfunction of lumbar region: Secondary | ICD-10-CM | POA: Diagnosis not present

## 2019-09-22 DIAGNOSIS — M62830 Muscle spasm of back: Secondary | ICD-10-CM | POA: Diagnosis not present

## 2019-09-22 NOTE — Progress Notes (Addendum)
ADDENDUM:  Chart reviewed by anesthesia, Jodell Cipro PA, ok to proceed.  Spoke w/ via phone for pre-op interview--- PT Lab needs dos----  no             Lab results------ no COVID test ------ 09-27-2019 @ 1450 Arrive at ------- 1000 NPO after ------ [place] Medications to take morning of surgery ----- Vesicare w/ sips of water Diabetic medication ----- n/a Patient Special Instructions ----- asked pt to bring cpap/ mask/ tubing with him dos Pre-Op special Istructions ----- n/a Patient verbalized understanding of instructions that were given at this phone interview. Patient denies shortness of breath, chest pain, fever, cough a this phone interview.   Anesthesia Review: hx  Tight ICA dissection in 2001 from football injury, CVA w/ left hemiparesis.  Chart to be reviewed by Jodell Cipro PA.  Pt denies stroke s&s.  PCP:  Dr Pearson Grippe Cardiologist : no Vascular:  Dr Myra Gianotti (lov 01-25-2018 epic, prn basis) WT management clinic @ [facility] Chest x-ray : no EKG :   07-17-2019 [facility] Carotid duplex:  07-16-2017 epic CT angio neck:  12-29-2017 epic Echo : no Stress test:  Nuclear 09-06-2009 care [facility] Cardiac Cath :  no Sleep Study/ CPAP :  YES/YES Fasting Blood Sugar :      / Checks Blood Sugar -- times a day:  N/A Blood Thinner/ Instructions Maurice Small Dose: NO ASA / Instructions/ Last Dose :  NO

## 2019-09-24 DIAGNOSIS — M9901 Segmental and somatic dysfunction of cervical region: Secondary | ICD-10-CM | POA: Diagnosis not present

## 2019-09-24 DIAGNOSIS — M545 Low back pain: Secondary | ICD-10-CM | POA: Diagnosis not present

## 2019-09-24 DIAGNOSIS — M62830 Muscle spasm of back: Secondary | ICD-10-CM | POA: Diagnosis not present

## 2019-09-24 DIAGNOSIS — M9903 Segmental and somatic dysfunction of lumbar region: Secondary | ICD-10-CM | POA: Diagnosis not present

## 2019-09-24 DIAGNOSIS — M62838 Other muscle spasm: Secondary | ICD-10-CM | POA: Diagnosis not present

## 2019-09-27 ENCOUNTER — Other Ambulatory Visit (HOSPITAL_COMMUNITY)
Admission: RE | Admit: 2019-09-27 | Discharge: 2019-09-27 | Disposition: A | Discharge status: Home / Self Care | Payer: Federal, State, Local not specified - PPO | Source: Ambulatory Visit | Attending: Urology | Admitting: Urology

## 2019-09-27 DIAGNOSIS — Z20822 Contact with and (suspected) exposure to covid-19: Secondary | ICD-10-CM | POA: Diagnosis not present

## 2019-09-27 DIAGNOSIS — Z01812 Encounter for preprocedural laboratory examination: Secondary | ICD-10-CM | POA: Diagnosis not present

## 2019-09-27 LAB — SARS CORONAVIRUS 2 (TAT 6-24 HRS): SARS Coronavirus 2: NEGATIVE

## 2019-09-29 DIAGNOSIS — Z713 Dietary counseling and surveillance: Secondary | ICD-10-CM | POA: Diagnosis not present

## 2019-09-29 DIAGNOSIS — G4733 Obstructive sleep apnea (adult) (pediatric): Secondary | ICD-10-CM | POA: Diagnosis not present

## 2019-09-29 DIAGNOSIS — E669 Obesity, unspecified: Secondary | ICD-10-CM | POA: Diagnosis not present

## 2019-09-29 DIAGNOSIS — Z6838 Body mass index (BMI) 38.0-38.9, adult: Secondary | ICD-10-CM | POA: Diagnosis not present

## 2019-09-30 ENCOUNTER — Ambulatory Visit (HOSPITAL_BASED_OUTPATIENT_CLINIC_OR_DEPARTMENT_OTHER): Payer: Federal, State, Local not specified - PPO | Admitting: Physician Assistant

## 2019-09-30 ENCOUNTER — Ambulatory Visit (HOSPITAL_BASED_OUTPATIENT_CLINIC_OR_DEPARTMENT_OTHER)
Admission: RE | Admit: 2019-09-30 | Discharge: 2019-09-30 | Disposition: A | Discharge status: Home / Self Care | Payer: Federal, State, Local not specified - PPO | Attending: Urology | Admitting: Urology

## 2019-09-30 ENCOUNTER — Encounter (HOSPITAL_BASED_OUTPATIENT_CLINIC_OR_DEPARTMENT_OTHER): Payer: Self-pay | Admitting: Urology

## 2019-09-30 ENCOUNTER — Encounter (HOSPITAL_BASED_OUTPATIENT_CLINIC_OR_DEPARTMENT_OTHER)
Admission: RE | Disposition: A | Discharge status: Home / Self Care | Payer: Self-pay | Source: Home / Self Care | Attending: Urology

## 2019-09-30 DIAGNOSIS — G4730 Sleep apnea, unspecified: Secondary | ICD-10-CM | POA: Insufficient documentation

## 2019-09-30 DIAGNOSIS — G4733 Obstructive sleep apnea (adult) (pediatric): Secondary | ICD-10-CM | POA: Diagnosis not present

## 2019-09-30 DIAGNOSIS — T8189XA Other complications of procedures, not elsewhere classified, initial encounter: Secondary | ICD-10-CM | POA: Diagnosis not present

## 2019-09-30 DIAGNOSIS — Y838 Other surgical procedures as the cause of abnormal reaction of the patient, or of later complication, without mention of misadventure at the time of the procedure: Secondary | ICD-10-CM | POA: Insufficient documentation

## 2019-09-30 DIAGNOSIS — Q5529 Other congenital malformations of testis and scrotum: Secondary | ICD-10-CM | POA: Diagnosis not present

## 2019-09-30 DIAGNOSIS — I69398 Other sequelae of cerebral infarction: Secondary | ICD-10-CM | POA: Insufficient documentation

## 2019-09-30 DIAGNOSIS — Z8679 Personal history of other diseases of the circulatory system: Secondary | ICD-10-CM | POA: Insufficient documentation

## 2019-09-30 DIAGNOSIS — Q542 Hypospadias, penoscrotal: Secondary | ICD-10-CM | POA: Diagnosis not present

## 2019-09-30 HISTORY — DX: Body mass index (BMI) 38.0-38.9, adult: Z68.38

## 2019-09-30 HISTORY — DX: Personal history of other diseases of the nervous system and sense organs: Z86.69

## 2019-09-30 HISTORY — DX: Urgency of urination: R39.15

## 2019-09-30 HISTORY — PX: HYDROCELE EXCISION: SHX482

## 2019-09-30 HISTORY — DX: Personal history of other diseases of the circulatory system: Z86.79

## 2019-09-30 HISTORY — DX: Unspecified osteoarthritis, unspecified site: M19.90

## 2019-09-30 HISTORY — DX: Obstructive sleep apnea (adult) (pediatric): G47.33

## 2019-09-30 HISTORY — DX: Unspecified sequelae of cerebral infarction: I69.30

## 2019-09-30 HISTORY — DX: Hemiplegia and hemiparesis following cerebral infarction affecting left non-dominant side: I69.354

## 2019-09-30 SURGERY — HYDROCELECTOMY
Anesthesia: General | Site: Scrotum

## 2019-09-30 MED ORDER — ACETAMINOPHEN 500 MG PO TABS
1000.0000 mg | ORAL_TABLET | Freq: Once | ORAL | Status: AC
  Administered 2019-09-30: 1000 mg via ORAL
  Filled 2019-09-30: qty 2

## 2019-09-30 MED ORDER — ONDANSETRON HCL 4 MG/2ML IJ SOLN
INTRAMUSCULAR | Status: DC | PRN
  Administered 2019-09-30: 4 mg via INTRAVENOUS

## 2019-09-30 MED ORDER — MIDAZOLAM HCL 5 MG/5ML IJ SOLN
INTRAMUSCULAR | Status: DC | PRN
  Administered 2019-09-30: 2 mg via INTRAVENOUS

## 2019-09-30 MED ORDER — DEXAMETHASONE SODIUM PHOSPHATE 10 MG/ML IJ SOLN
INTRAMUSCULAR | Status: DC | PRN
  Administered 2019-09-30: 10 mg via INTRAVENOUS

## 2019-09-30 MED ORDER — OXYCODONE HCL 5 MG PO TABS
5.0000 mg | ORAL_TABLET | Freq: Once | ORAL | Status: AC
  Administered 2019-09-30: 5 mg via ORAL
  Filled 2019-09-30: qty 1

## 2019-09-30 MED ORDER — TRAMADOL HCL 50 MG PO TABS: 50.0000 mg | ORAL_TABLET | Freq: Four times a day (QID) | ORAL | 0 refills | Status: AC | PRN

## 2019-09-30 MED ORDER — LACTATED RINGERS IV SOLN
INTRAVENOUS | Status: DC
  Filled 2019-09-30: qty 1000

## 2019-09-30 MED ORDER — ACETAMINOPHEN 500 MG PO TABS
ORAL_TABLET | ORAL | Status: AC
  Filled 2019-09-30: qty 2

## 2019-09-30 MED ORDER — ONDANSETRON HCL 4 MG/2ML IJ SOLN
INTRAMUSCULAR | Status: AC
  Filled 2019-09-30: qty 2

## 2019-09-30 MED ORDER — FENTANYL CITRATE (PF) 100 MCG/2ML IJ SOLN
INTRAMUSCULAR | Status: DC | PRN
  Administered 2019-09-30: 100 ug via INTRAVENOUS

## 2019-09-30 MED ORDER — DEXAMETHASONE SODIUM PHOSPHATE 10 MG/ML IJ SOLN
INTRAMUSCULAR | Status: AC
  Filled 2019-09-30: qty 1

## 2019-09-30 MED ORDER — LIDOCAINE 2% (20 MG/ML) 5 ML SYRINGE
INTRAMUSCULAR | Status: AC
  Filled 2019-09-30: qty 5

## 2019-09-30 MED ORDER — PROPOFOL 10 MG/ML IV BOLUS
INTRAVENOUS | Status: AC
  Filled 2019-09-30: qty 20

## 2019-09-30 MED ORDER — CEFAZOLIN SODIUM-DEXTROSE 2-4 GM/100ML-% IV SOLN
2.0000 g | INTRAVENOUS | Status: AC
  Administered 2019-09-30: 2 g via INTRAVENOUS
  Filled 2019-09-30: qty 100

## 2019-09-30 MED ORDER — FENTANYL CITRATE (PF) 100 MCG/2ML IJ SOLN
25.0000 ug | INTRAMUSCULAR | Status: DC | PRN
  Filled 2019-09-30: qty 1

## 2019-09-30 MED ORDER — LIDOCAINE 2% (20 MG/ML) 5 ML SYRINGE
INTRAMUSCULAR | Status: DC | PRN
  Administered 2019-09-30: 100 mg via INTRAVENOUS

## 2019-09-30 MED ORDER — PROPOFOL 10 MG/ML IV BOLUS
INTRAVENOUS | Status: DC | PRN
  Administered 2019-09-30: 200 mg via INTRAVENOUS

## 2019-09-30 MED ORDER — BUPIVACAINE HCL (PF) 0.25 % IJ SOLN
INTRAMUSCULAR | Status: DC | PRN
  Administered 2019-09-30: 22 mL

## 2019-09-30 MED ORDER — CEFAZOLIN SODIUM-DEXTROSE 2-4 GM/100ML-% IV SOLN
INTRAVENOUS | Status: AC
  Filled 2019-09-30: qty 100

## 2019-09-30 MED ORDER — OXYCODONE HCL 5 MG PO TABS
ORAL_TABLET | ORAL | Status: AC
  Filled 2019-09-30: qty 1

## 2019-09-30 MED ORDER — MIDAZOLAM HCL 2 MG/2ML IJ SOLN
INTRAMUSCULAR | Status: AC
  Filled 2019-09-30: qty 2

## 2019-09-30 MED ORDER — FENTANYL CITRATE (PF) 100 MCG/2ML IJ SOLN
INTRAMUSCULAR | Status: AC
  Filled 2019-09-30: qty 2

## 2019-09-30 SURGICAL SUPPLY — 43 items
BLADE CLIPPER SENSICLIP SURGIC (BLADE) ×2 IMPLANT
BLADE SURG 15 STRL LF DISP TIS (BLADE) ×1 IMPLANT
BLADE SURG 15 STRL SS (BLADE) ×1
BNDG GAUZE ELAST 4 BULKY (GAUZE/BANDAGES/DRESSINGS) ×2 IMPLANT
BRIEF STRETCH FOR OB PAD LRG (UNDERPADS AND DIAPERS) ×2 IMPLANT
CANISTER SUCT 3000ML PPV (MISCELLANEOUS) ×2 IMPLANT
CLEANER CAUTERY TIP 5X5 PAD (MISCELLANEOUS) ×1 IMPLANT
COVER BACK TABLE 60X90IN (DRAPES) ×2 IMPLANT
COVER MAYO STAND STRL (DRAPES) ×2 IMPLANT
COVER WAND RF STERILE (DRAPES) ×2 IMPLANT
DERMABOND ADVANCED (GAUZE/BANDAGES/DRESSINGS) ×1
DERMABOND ADVANCED .7 DNX12 (GAUZE/BANDAGES/DRESSINGS) ×1 IMPLANT
DRAPE LAPAROTOMY 100X72 PEDS (DRAPES) ×2 IMPLANT
ELECT NEEDLE BLADE 2-5/6 (NEEDLE) ×2 IMPLANT
ELECT REM PT RETURN 9FT ADLT (ELECTROSURGICAL) ×2
ELECTRODE REM PT RTRN 9FT ADLT (ELECTROSURGICAL) ×1 IMPLANT
GLOVE BIO SURGEON STRL SZ 6 (GLOVE) ×4 IMPLANT
GLOVE BIO SURGEON STRL SZ7.5 (GLOVE) ×2 IMPLANT
GLOVE BIOGEL PI IND STRL 7.0 (GLOVE) ×1 IMPLANT
GLOVE BIOGEL PI IND STRL 8.5 (GLOVE) ×1 IMPLANT
GLOVE BIOGEL PI INDICATOR 7.0 (GLOVE) ×1
GLOVE BIOGEL PI INDICATOR 8.5 (GLOVE) ×1
GLOVE SURG SS PI 8.5 STRL IVOR (GLOVE) ×1
GLOVE SURG SS PI 8.5 STRL STRW (GLOVE) ×1 IMPLANT
GOWN STRL REUS W/ TWL LRG LVL3 (GOWN DISPOSABLE) ×1 IMPLANT
GOWN STRL REUS W/ TWL XL LVL3 (GOWN DISPOSABLE) ×2 IMPLANT
GOWN STRL REUS W/TWL LRG LVL3 (GOWN DISPOSABLE) ×1
GOWN STRL REUS W/TWL XL LVL3 (GOWN DISPOSABLE) ×2
KIT TURNOVER CYSTO (KITS) ×2 IMPLANT
NEEDLE HYPO 22GX1.5 SAFETY (NEEDLE) ×2 IMPLANT
NS IRRIG 500ML POUR BTL (IV SOLUTION) ×2 IMPLANT
PACK BASIN DAY SURGERY FS (CUSTOM PROCEDURE TRAY) ×2 IMPLANT
PAD CLEANER CAUTERY TIP 5X5 (MISCELLANEOUS) ×1
PENCIL BUTTON HOLSTER BLD 10FT (ELECTRODE) ×2 IMPLANT
SUT MNCRL AB 4-0 PS2 18 (SUTURE) ×4 IMPLANT
SUT VIC AB 3-0 SH 27 (SUTURE) ×2
SUT VIC AB 3-0 SH 27X BRD (SUTURE) ×2 IMPLANT
SYR BULB IRRIGATION 50ML (SYRINGE) ×2 IMPLANT
SYR CONTROL 10ML LL (SYRINGE) ×2 IMPLANT
TOWEL OR 17X26 10 PK STRL BLUE (TOWEL DISPOSABLE) ×2 IMPLANT
TRAY DSU PREP LF (CUSTOM PROCEDURE TRAY) ×2 IMPLANT
TUBE CONNECTING 12X1/4 (SUCTIONS) ×2 IMPLANT
YANKAUER SUCT BULB TIP NO VENT (SUCTIONS) ×2 IMPLANT

## 2019-09-30 NOTE — Interval H&P Note (Signed)
History and Physical Interval Note:  09/30/2019 11:38 AM  Travis Mullins  has presented today for surgery, with the diagnosis of SCROTAL WEBBING.  The various methods of treatment have been discussed with the patient and family. After consideration of risks, benefits and other options for treatment, the patient has consented to  Procedure(s): SCROTOPLASTY (N/A) as a surgical intervention.  The patient's history has been reviewed, patient examined, no change in status, stable for surgery.  I have reviewed the patient's chart and labs.  Questions were answered to the patient's satisfaction.     Crist Fat

## 2019-09-30 NOTE — Discharge Instructions (Signed)
Discharge instructions following scrotal surgery  Call your doctor for:  Fever is greater than 100.5  Severe nausea or vomiting  Increasing pain not controlled by pain medication  Increasing redness or drainage from incisions  The number for questions or concerns is [phone]  Activity level: No lifting greater than 10 pounds (about equal to milk) for the next 2 weeks or until cleared to do so at follow-up appointment.  Otherwise activity as tolerated by comfort level.  Diet: May resume your regular diet as tolerated  Driving: No driving while still taking opiate pain medications (weight at least 6-8 hours after last dose).  No driving if you still sore from surgery as it may limit her ability to react quickly if necessary.   Shower/bath: May shower and get incision wet pad dry immediately following.  Do not scrub vigorously for the next 2-3 weeks.  Do not soak incision (ID soaking in bath or swimming) until told he may do so by Dr., as this may promote a wound infection.  Wound care: He may cover wounds with sterile gauze as needed to prevent incisions rubbing on close follow-up in any seepage.  Where tight fitting underpants for at least 2 weeks.  He should apply cold compresses (ice or sac of frozen peas/corn) to your scrotum for at least 48 hours to reduce the swelling.  You should expect that his scrotum will swell up initially and then get smaller over the next 2-4 weeks.  Follow-up appointments: Follow-up appointment will be scheduled with [facility] in 2 weeks.   Post Anesthesia Home Care Instructions  Activity: Get plenty of rest for the remainder of the day. A responsible individual must stay with you for 24 hours following the procedure.  For the next 24 hours, DO NOT: -Drive a car -Advertising copywriter -Drink alcoholic beverages -Take any medication unless instructed by your physician -Make any legal decisions or sign important papers.  Meals: Start  with liquid foods such as gelatin or soup. Progress to regular foods as tolerated. Avoid greasy, spicy, heavy foods. If nausea and/or vomiting occur, drink only clear liquids until the nausea and/or vomiting subsides. Call your physician if vomiting continues.  Special Instructions/Symptoms: Your throat may feel dry or sore from the anesthesia or the breathing tube placed in your throat during surgery. If this causes discomfort, gargle with warm salt water. The discomfort should disappear within 24 hours.

## 2019-09-30 NOTE — Anesthesia Procedure Notes (Signed)
Procedure Name: LMA Insertion Date/Time: 09/30/2019 11:54 AM Performed by: Chadric Kimberley D, CRNA Pre-anesthesia Checklist: Patient identified, Emergency Drugs available, Suction available and Patient being monitored Patient Re-evaluated:Patient Re-evaluated prior to induction Oxygen Delivery Method: Circle system utilized Preoxygenation: Pre-oxygenation with 100% oxygen Induction Type: IV induction Ventilation: Mask ventilation without difficulty LMA: LMA inserted LMA Size: 5.0 Tube type: Oral Number of attempts: 1 Placement Confirmation: positive ETCO2 and breath sounds checked- equal and bilateral Tube secured with: Tape Dental Injury: Teeth and Oropharynx as per pre-operative assessment

## 2019-09-30 NOTE — H&P (Signed)
The patient is here today to discuss scrotal web. He is status post circumcision. He underwent a circumcision 2 months ago. This healed nicely. He has been now voiding well and is happy not to have to deal with his tight 5 moderate ache foreskin. However, the scrotal webbing that he now has is prevented him from having intercourse, providing him from penetrating. He is not having any significant pain, but the extra skin is limiting him.     ALLERGIES: No Known Drug Allergies    MEDICATIONS: Vesicare 10 mg tablet 1 tablet PO Daily  Baclofen 5 mg tablet  Sildenafil Citrate 20 mg tablet     GU PSH: Non-Newborn Circumcision - 05/27/2019     NON-GU PSH: None   GU PMH: Phimosis - 04/18/2019 Epididymitis - 04/13/2018 Urinary Urgency - 04/13/2018 Varicocele - 04/13/2018      PMH Notes: The patient has a history of carotid artery dissection and stroke while playing football at the age of 35. Since that time he has had overactive bladder and urinary frequency. His stream is strong. He does have some incontinence. He did take Vesicare one point, which made a big difference, but was very expensive. He denies any hematuria or dysuria.   The patient has a history of right epididymal orchitis. This is been treated but he has a constant dull pain in that area. He denies any pain with ejaculation.   The patient also describes erectile dysfunction. He does have erections in the morning. However, when he is closed with his partner he has performance anxiety. He was given sildenafil for this and is taken 20 mg once. This did not seem to make a bag difference.   The patient also has a history of bilateral varicoceles. He is concerned about fertility.    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   Tobacco Use Assessment Completed: Used Tobacco in last  30 days? Has never drank.  Does not drink caffeine. Patient's occupation Metallurgist.    REVIEW OF SYSTEMS:    GU Review Male:   Patient reports frequent urination and hard to postpone urination. Patient denies burning/ pain with urination, get up at night to urinate, leakage of urine, stream starts and stops, trouble starting your stream, have to strain to urinate , erection problems, and penile pain.  Gastrointestinal (Upper):   Patient denies nausea, vomiting, and indigestion/ heartburn.  Gastrointestinal (Lower):   Patient reports constipation. Patient denies diarrhea.  Constitutional:   Patient denies fever, night sweats, weight loss, and fatigue.  Skin:   Patient denies skin rash/ lesion and itching.  Eyes:   Patient denies blurred vision and double vision.  Ears/ Nose/ Throat:   Patient denies sore throat and sinus problems.  Hematologic/Lymphatic:   Patient denies swollen glands and easy bruising.  Cardiovascular:   Patient denies leg swelling and chest pains.  Respiratory:   Patient denies cough and shortness of breath.  Endocrine:   Patient denies excessive thirst.  Musculoskeletal:   Patient denies back pain and joint pain.  Neurological:   Patient denies dizziness and headaches.  Psychologic:   Patient denies depression and anxiety.   VITAL SIGNS:      07/26/2019 02:49 PM  Weight 300 lb / 136.08 kg  BP 133/83 mmHg  Pulse 81 /min  Temperature 97.1 F / 36.1 C   GU PHYSICAL  EXAMINATION:      Notes: The patient's circumcision incision is nicely healed. The scrotal web does come out to near the foreskin collar. He has significant laxity of the scrotum.     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 I have discussed the surgery with the patient detail and will plan to do this under general anesthesia. Try to get this done the patient's convenience I went through the risks and the benefits including the risk of undesired appearance. We also discussed the risk of tissue breakdown.

## 2019-09-30 NOTE — Op Note (Addendum)
Preoperative Diagnosis:  Penoscrotal webbing Postoperative Diagnosis:  Same  Procedure(s) Performed:  Scrotoplasty  Teaching Surgeon:  Berniece Salines, M.D.  Resident Surgeon:  Elio Forget, MD  Assistant(s):  None  Anesthesia:  General, 0.25% bupivacaine without epinephrine  Fluids:  See anesthesia record  Estimated blood loss:  Minimal  Specimens:  None   Complications:  None  Indications: 39 yo male with a history of penoscrotal webbing.   He understands the risks/compilcations and agrees to proceed.   Findings:  Excess webbed scrotal skin excised and repaired successfully.   Description:  The patient was correctly identified in the preop holding area where written informed consent as well potential risks and complications were reviewed. He agreed. He was brought back to the operative suite where a preinduction timeout was performed. Once correct information was verified, general anesthesia was induced. They were then gently placed into supine position and prepped and draped in the usual sterile fashion.  Antibiotics were administered and a second timeout was then performed.  A penile ring block was performed for local anesthesia.  We selected the genitalia and noted the excess webbed scrotal skin which was causing patient symptoms.  We began by marking out our intended incision as well as the site of the anticipated penoscrotal junction.  We excised a diamond shaped wedge of excess scrotal skin along her previously demarcated lines using electrocautery on cut for skin and cautery for subcutaneous layer.  We released fibrotic bands that were adhering the penis to the underlying scrotal tissue.  During dissection, a small opening made in dartos over the left testis was closed using interrupted locking 3-0 Vicryl suture.  We ensured adequate hemostasis using Bovie electrocautery.  We then reconstructed the penoscrotal junction by approximating the underlying tissue with and and  interrupted 3-0 Vicryl suture.  Additional interrupted 3-0 Vicryl sutures were placed along the length of the midline scrotal opening and midline ventral penile defect that had been left by the scrotal skin excision in order to reapproximate the deep layer.  We then closed the penile skin in longitudinal fashion using 4-0 Monocryl in a running subcuticular fashion.  The scrotal skin was closed separately with 4-0 Monocryl in a running horizontal mattress fashion.  We were satisfied with final appearance of scrotoplasty.  The patient was awoken from anesthesia and taken to the recovery area.

## 2019-09-30 NOTE — Transfer of Care (Signed)
Immediate Anesthesia Transfer of Care Note  Patient: Travis Mullins  Procedure(s) Performed: SCROTOPLASTY (N/A Scrotum)  Patient Location: PACU  Anesthesia Type:General  Level of Consciousness: awake, alert  and oriented  Airway & Oxygen Therapy: Patient Spontanous Breathing and Patient connected to nasal cannula oxygen  Post-op Assessment: Report given to RN and Post -op Vital signs reviewed and stable  Post vital signs: Reviewed and stable  Last Vitals:  Vitals Value Taken Time  BP 140/58 09/30/19 1325  Temp 36.8 C 09/30/19 1325  Pulse 77 09/30/19 1328  Resp 14 09/30/19 1328  SpO2 99 % 09/30/19 1328  Vitals shown include unvalidated device data.  Last Pain:  Vitals:   09/30/19 1325  TempSrc:   PainSc: (P) 0-No pain      Patients Stated Pain Goal: 5 (09/30/19 1019)  Complications: No apparent anesthesia complications

## 2019-09-30 NOTE — Anesthesia Preprocedure Evaluation (Addendum)
Anesthesia Evaluation  Patient identified by MRN, date of birth, ID band Patient awake    Reviewed: Allergy & Precautions, NPO status , Patient's Chart, lab work & pertinent test results  Airway Mallampati: III  TM Distance: >3 FB Neck ROM: Full  Mouth opening: Limited Mouth Opening  Dental no notable dental hx. (+) Teeth Intact, Dental Advisory Given   Pulmonary sleep apnea and Continuous Positive Airway Pressure Ventilation ,    Pulmonary exam normal breath sounds clear to auscultation       Cardiovascular Normal cardiovascular exam Rhythm:Regular Rate:Normal  S/p right ICA dissection 2001   Neuro/Psych CVA (2001, left sided weakness, spasticity, and flexion; right sided facial droop), Residual Symptoms negative psych ROS   GI/Hepatic negative GI ROS, Neg liver ROS,   Endo/Other  negative endocrine ROS  Renal/GU negative Renal ROS  negative genitourinary   Musculoskeletal  (+) Arthritis ,   Abdominal   Peds  Hematology negative hematology ROS (+)   Anesthesia Other Findings   Reproductive/Obstetrics                            Anesthesia Physical Anesthesia Plan  ASA: III  Anesthesia Plan: General   Post-op Pain Management:    Induction: Intravenous  PONV Risk Score and Plan: 2 and Ondansetron, Dexamethasone and Midazolam  Airway Management Planned: LMA  Additional Equipment:   Intra-op Plan:   Post-operative Plan: Extubation in OR  Informed Consent: I have reviewed the patients History and Physical, chart, labs and discussed the procedure including the risks, benefits and alternatives for the proposed anesthesia with the patient or authorized representative who has indicated his/her understanding and acceptance.     Dental advisory given  Plan Discussed with: CRNA  Anesthesia Plan Comments:         Anesthesia Quick Evaluation

## 2019-10-03 NOTE — Anesthesia Postprocedure Evaluation (Signed)
Anesthesia Post Note  Patient: Travis Mullins  Procedure(s) Performed: SCROTOPLASTY (N/A Scrotum)     Patient location during evaluation: PACU Anesthesia Type: General Level of consciousness: awake and alert Pain management: pain level controlled Vital Signs Assessment: post-procedure vital signs reviewed and stable Respiratory status: spontaneous breathing, nonlabored ventilation, respiratory function stable and patient connected to nasal cannula oxygen Cardiovascular status: stable and blood pressure returned to baseline Postop Assessment: no apparent nausea or vomiting Anesthetic complications: no    Last Vitals:  Vitals:   09/30/19 1400 09/30/19 1447  BP: 135/87 (!) 150/104  Pulse: 64 70  Resp: 15 16  Temp:  (!) 36.4 C  SpO2: 98% 100%    Last Pain:  Vitals:   09/30/19 1447  TempSrc: Oral  PainSc: 3                  Franci Oshana L Jaycee Mckellips

## 2019-10-07 DIAGNOSIS — Z713 Dietary counseling and surveillance: Secondary | ICD-10-CM | POA: Diagnosis not present

## 2019-10-07 DIAGNOSIS — E669 Obesity, unspecified: Secondary | ICD-10-CM | POA: Diagnosis not present

## 2019-10-07 DIAGNOSIS — Z6838 Body mass index (BMI) 38.0-38.9, adult: Secondary | ICD-10-CM | POA: Diagnosis not present

## 2019-10-13 DIAGNOSIS — F4323 Adjustment disorder with mixed anxiety and depressed mood: Secondary | ICD-10-CM | POA: Diagnosis not present

## 2019-10-14 DIAGNOSIS — Q558 Other specified congenital malformations of male genital organs: Secondary | ICD-10-CM | POA: Diagnosis not present

## 2019-10-18 DIAGNOSIS — E669 Obesity, unspecified: Secondary | ICD-10-CM | POA: Diagnosis not present

## 2019-10-18 DIAGNOSIS — Z6838 Body mass index (BMI) 38.0-38.9, adult: Secondary | ICD-10-CM | POA: Diagnosis not present

## 2019-10-18 DIAGNOSIS — Z713 Dietary counseling and surveillance: Secondary | ICD-10-CM | POA: Diagnosis not present

## 2019-11-03 DIAGNOSIS — F4323 Adjustment disorder with mixed anxiety and depressed mood: Secondary | ICD-10-CM | POA: Diagnosis not present

## 2019-11-04 DIAGNOSIS — Z713 Dietary counseling and surveillance: Secondary | ICD-10-CM | POA: Diagnosis not present

## 2019-11-04 DIAGNOSIS — Z6837 Body mass index (BMI) 37.0-37.9, adult: Secondary | ICD-10-CM | POA: Diagnosis not present

## 2019-11-04 DIAGNOSIS — G4733 Obstructive sleep apnea (adult) (pediatric): Secondary | ICD-10-CM | POA: Diagnosis not present

## 2019-11-04 DIAGNOSIS — E669 Obesity, unspecified: Secondary | ICD-10-CM | POA: Diagnosis not present

## 2019-11-17 DIAGNOSIS — F4323 Adjustment disorder with mixed anxiety and depressed mood: Secondary | ICD-10-CM | POA: Diagnosis not present

## 2019-12-01 DIAGNOSIS — F4323 Adjustment disorder with mixed anxiety and depressed mood: Secondary | ICD-10-CM | POA: Diagnosis not present

## 2019-12-18 IMAGING — CT CT ANGIO NECK
3 of 9 series · 5 of 33 positions shown · IV contrast (iopamidol)
Comparison: None.

CLINICAL DATA: Neck pain rule out carotid dissection. History of
stroke 1668 from football injury.

EXAM:
CT ANGIOGRAPHY NECK
TECHNIQUE: Multidetector CT imaging of the neck was performed using the
standard protocol during bolus administration of intravenous
contrast. Multiplanar CT image reconstructions and MIPs were
obtained to evaluate the vascular anatomy. Carotid stenosis
measurements (when applicable) are obtained utilizing NASCET
criteria, using the distal internal carotid diameter as the
denominator.
CONTRAST:  75mL YKPGK1-ZMA IOPAMIDOL (YKPGK1-ZMA) INJECTION 76%

[Series 7: cta neck 1.00 bv48 s3 ax ax thin mips · axial · 0.41mm/px · z∈[-730,-648]mm · 2 of 246 slices shown (1 of 2)]
[im 82/246  soft-tissue]
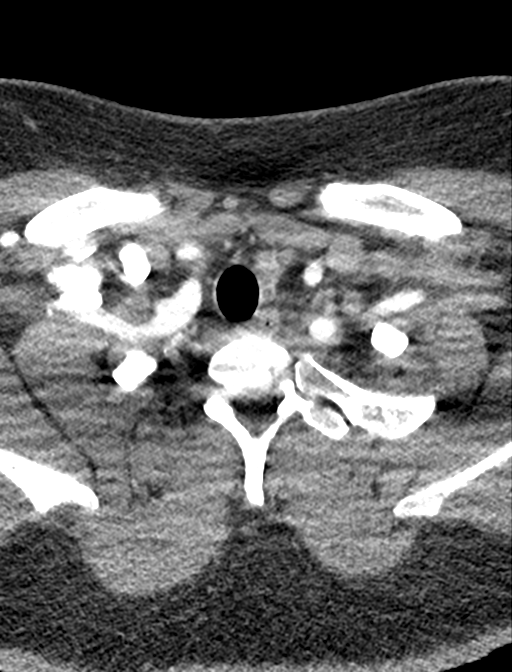
[im 164/246  soft-tissue]
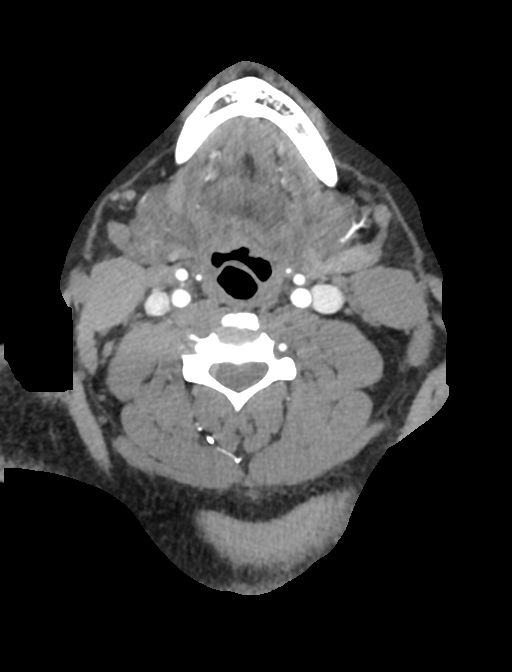

[Series 11: cta neck 1.00 bv48 s3 sag sag thin mips · sagittal · 0.48mm/px · 1 of 208 slices shown]
[im 104/208  soft-tissue]
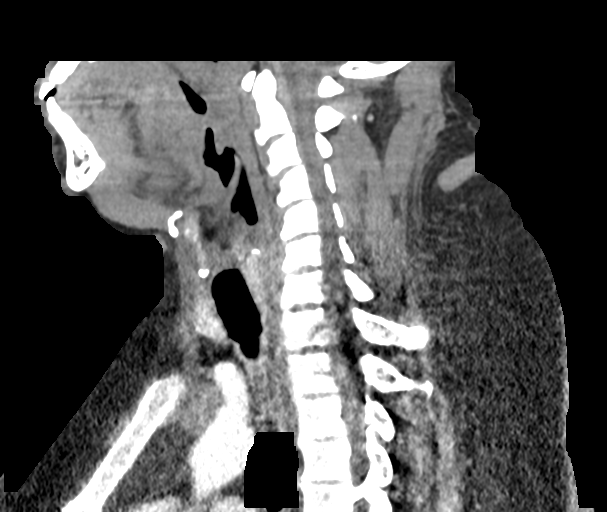

[Series 20: cta neck 1.00 bv48 s3 ax ax thin mips · axial · 0.41mm/px · z∈[-722,-623]mm · 2 of 299 slices shown (2 of 2)]
[im 100/299  soft-tissue]
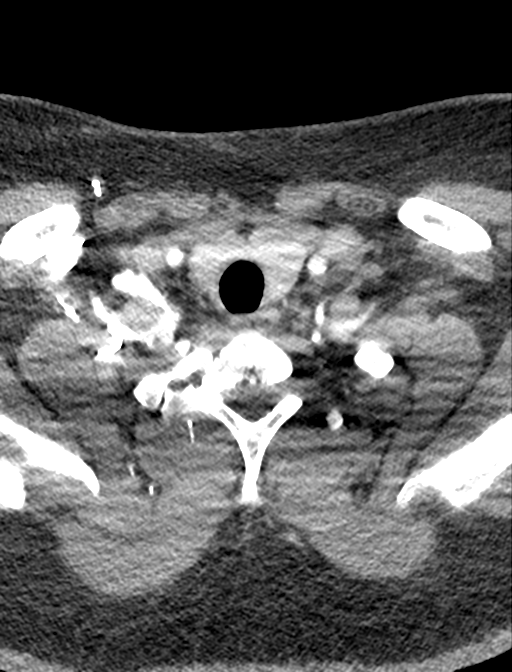
[im 199/299  bone]
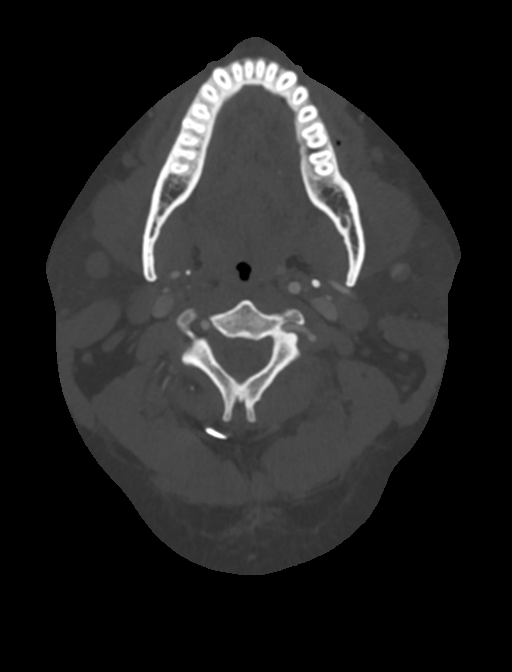

[5 of 33 positions shown; findings below may reference images not displayed]

FINDINGS: Aortic arch: Normal aortic arch and proximal great vessels.

Right carotid system: Right common carotid artery normal. Right
external carotid artery normal. There is smooth tapering of the
proximal internal carotid artery with a tiny lumen patent to the
skull base. This measures under 1 mm and is consistent with a string
sign.

Partial intracranial imaging demonstrates right MCA chronic infarct
with irregularity and narrowing of the right M1 and M2 segments.

Left carotid system: Normal left carotid system without dissection
or stenosis.

Vertebral arteries: Both vertebral arteries are normal without
dissection or stenosis

Skeleton: Negative

Other neck: Negative

Upper chest: Lung apices clear.  Superior mediastinum normal.
IMPRESSION: Findings compatible with carotid dissection, likely chronic. There
is smooth tapering of the right internal carotid artery with a tiny
lumen patent to the skull base compatible with string sign.

Partial imaging of intracranial contents reveals chronic right MCA
infarct with narrowing and irregularity of the right M1 segment and
M2 segments.

These results were called by telephone at the time of interpretation
on 12/29/2017 at [DATE] to Dr. AFROZA KNUTH , who verbally acknowledged
these results.

## 2019-12-29 DIAGNOSIS — F4323 Adjustment disorder with mixed anxiety and depressed mood: Secondary | ICD-10-CM | POA: Diagnosis not present

## 2020-01-19 DIAGNOSIS — F4323 Adjustment disorder with mixed anxiety and depressed mood: Secondary | ICD-10-CM | POA: Diagnosis not present

## 2020-02-02 DIAGNOSIS — G4733 Obstructive sleep apnea (adult) (pediatric): Secondary | ICD-10-CM | POA: Diagnosis not present

## 2020-02-16 DIAGNOSIS — R3915 Urgency of urination: Secondary | ICD-10-CM | POA: Diagnosis not present

## 2020-02-16 DIAGNOSIS — F5221 Male erectile disorder: Secondary | ICD-10-CM | POA: Diagnosis not present

## 2020-02-16 DIAGNOSIS — F4323 Adjustment disorder with mixed anxiety and depressed mood: Secondary | ICD-10-CM | POA: Diagnosis not present

## 2020-02-21 DIAGNOSIS — E669 Obesity, unspecified: Secondary | ICD-10-CM | POA: Diagnosis not present

## 2020-02-21 DIAGNOSIS — Z6834 Body mass index (BMI) 34.0-34.9, adult: Secondary | ICD-10-CM | POA: Diagnosis not present

## 2020-02-21 DIAGNOSIS — Z713 Dietary counseling and surveillance: Secondary | ICD-10-CM | POA: Diagnosis not present

## 2020-02-29 DIAGNOSIS — E669 Obesity, unspecified: Secondary | ICD-10-CM | POA: Diagnosis not present

## 2020-02-29 DIAGNOSIS — Z713 Dietary counseling and surveillance: Secondary | ICD-10-CM | POA: Diagnosis not present

## 2020-02-29 DIAGNOSIS — Z6834 Body mass index (BMI) 34.0-34.9, adult: Secondary | ICD-10-CM | POA: Diagnosis not present

## 2020-02-29 DIAGNOSIS — G4733 Obstructive sleep apnea (adult) (pediatric): Secondary | ICD-10-CM | POA: Diagnosis not present

## 2020-03-08 DIAGNOSIS — F4323 Adjustment disorder with mixed anxiety and depressed mood: Secondary | ICD-10-CM | POA: Diagnosis not present

## 2020-04-09 DIAGNOSIS — R3915 Urgency of urination: Secondary | ICD-10-CM | POA: Diagnosis not present

## 2020-04-09 DIAGNOSIS — M6281 Muscle weakness (generalized): Secondary | ICD-10-CM | POA: Diagnosis not present

## 2020-04-09 DIAGNOSIS — M62838 Other muscle spasm: Secondary | ICD-10-CM | POA: Diagnosis not present

## 2020-04-09 DIAGNOSIS — M6289 Other specified disorders of muscle: Secondary | ICD-10-CM | POA: Diagnosis not present

## 2020-04-16 DIAGNOSIS — D509 Iron deficiency anemia, unspecified: Secondary | ICD-10-CM | POA: Diagnosis not present

## 2020-04-16 DIAGNOSIS — G4733 Obstructive sleep apnea (adult) (pediatric): Secondary | ICD-10-CM | POA: Diagnosis not present

## 2020-04-16 DIAGNOSIS — I6930 Unspecified sequelae of cerebral infarction: Secondary | ICD-10-CM | POA: Diagnosis not present

## 2020-04-16 DIAGNOSIS — R5383 Other fatigue: Secondary | ICD-10-CM | POA: Diagnosis not present

## 2020-04-16 DIAGNOSIS — E669 Obesity, unspecified: Secondary | ICD-10-CM | POA: Diagnosis not present

## 2020-05-01 DIAGNOSIS — Z713 Dietary counseling and surveillance: Secondary | ICD-10-CM | POA: Diagnosis not present

## 2020-05-01 DIAGNOSIS — E669 Obesity, unspecified: Secondary | ICD-10-CM | POA: Diagnosis not present

## 2020-05-17 DIAGNOSIS — G4733 Obstructive sleep apnea (adult) (pediatric): Secondary | ICD-10-CM | POA: Diagnosis not present

## 2020-05-22 DIAGNOSIS — M6281 Muscle weakness (generalized): Secondary | ICD-10-CM | POA: Diagnosis not present

## 2020-05-22 DIAGNOSIS — M62838 Other muscle spasm: Secondary | ICD-10-CM | POA: Diagnosis not present

## 2020-05-22 DIAGNOSIS — M6289 Other specified disorders of muscle: Secondary | ICD-10-CM | POA: Diagnosis not present

## 2020-05-22 DIAGNOSIS — R3915 Urgency of urination: Secondary | ICD-10-CM | POA: Diagnosis not present

## 2020-06-04 DIAGNOSIS — G4733 Obstructive sleep apnea (adult) (pediatric): Secondary | ICD-10-CM | POA: Diagnosis not present

## 2020-06-04 DIAGNOSIS — R635 Abnormal weight gain: Secondary | ICD-10-CM | POA: Diagnosis not present

## 2020-06-04 DIAGNOSIS — I6930 Unspecified sequelae of cerebral infarction: Secondary | ICD-10-CM | POA: Diagnosis not present

## 2020-06-13 DIAGNOSIS — E669 Obesity, unspecified: Secondary | ICD-10-CM | POA: Diagnosis not present

## 2020-06-13 DIAGNOSIS — Z713 Dietary counseling and surveillance: Secondary | ICD-10-CM | POA: Diagnosis not present

## 2020-08-07 ENCOUNTER — Encounter (HOSPITAL_COMMUNITY): Payer: Self-pay | Admitting: Emergency Medicine

## 2020-08-07 ENCOUNTER — Ambulatory Visit (HOSPITAL_COMMUNITY)
Admission: EM | Admit: 2020-08-07 | Discharge: 2020-08-07 | Disposition: A | Discharge status: Home / Self Care | Payer: Federal, State, Local not specified - PPO

## 2020-08-07 ENCOUNTER — Other Ambulatory Visit: Payer: Self-pay

## 2020-08-07 DIAGNOSIS — I69351 Hemiplegia and hemiparesis following cerebral infarction affecting right dominant side: Secondary | ICD-10-CM

## 2020-08-07 DIAGNOSIS — M79675 Pain in left toe(s): Secondary | ICD-10-CM | POA: Diagnosis not present

## 2020-08-07 MED ORDER — TRIAMCINOLONE ACETONIDE 0.1 % EX CREA: 1.0000 "application " | TOPICAL_CREAM | Freq: Two times a day (BID) | CUTANEOUS | 0 refills | Status: AC

## 2020-08-07 NOTE — Discharge Instructions (Addendum)
-  Start the triamcinolone cream, applied to toe 1-2x daily.  -Wrap toe using toe pillow or similar, purchased OTC at pharmacy.  -Establish care with new podiatrist. Information below.  -Head straight to ED if new onset of weakness, worst headache of life, chest pain, etc.

## 2020-08-07 NOTE — ED Provider Notes (Signed)
[facility]    CSN: [id number] Arrival date & time: 08/07/20  1651      History   Chief Complaint Chief Complaint  Patient presents with  . Toe Pain    Left 4th    HPI Travis Mullins is a 40 y.o. male presenting with L 3rd toe pain x1 month. History arthritis, L  hemiparesis following CVA, bell's palsy, dissection internal carotid artery, OSA on CPAP. Patient states that in the past he has been followed by podiatry for ingrown toenails and toe pain caused by walking favoring his left leg. States the podiatrist removed L 3rd toenail 1 year ago and applied cream to prevent nail from regrowing. However, for the past month, this has been causing him pain. States he stands a lot for his job and his toes rub against shoe. Requesting cream to relieve pain. Denies fevers/chills, trauma. States he is no longer followed by podiatry due to "personal issue."  HPI  Past Medical History:  Diagnosis Date  . Arthritis    left ankle  . BMI 38.0-38.9,adult    09-22-2019 currently in [facility]xx management program @[facility]   . Hemiparesis affecting left side as late effect of cerebrovascular accident (CVA) (HCC)    2001  . History of Bell's palsy    2008---- left side, pt stated resolved  . History of CVA with residual deficit (09-22-2019 pt states has abnormal gait with normal balance due to left hemiparesis   2001 (age 15)  football base of skull head injury, 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 w/ left hemiparesis  . History of dissection of internal carotid artery followed by vascular on prn basis--- lov note 01-25-2018 in epic w/ dr 03-27-2018   2001---  football base of skull head injury dissection right ICA w/ occulsion (no surgical intervention)  . OSA on CPAP    09-22-2019 per pt uses every night  . Urgency of urination     There are no problems to display for this patient.   Past Surgical History:  Procedure Laterality Date  . CIRCUMCISION  05-27-2019   dr  14-04-2019 @[facility]   . HYDROCELE EXCISION N/A 09/30/2019   Procedure: ;  Surgeon: 10/02/2019, MD;  Location: [facility];  Service: Urology;  Laterality: N/A;       Home Medications    Prior to Admission medications   Medication Sig Start Date End Date Taking? Authorizing Provider  Crist Fat Tea, Camellia sinensis, (GREEN TEA EXTRACT PO) Take by mouth daily.   Yes [provider]  Multiple Vitamin (MULTIVITAMIN) tablet Take 1 tablet by mouth daily.   Yes [provider]  sildenafil (REVATIO) 20 MG tablet TAKE 1 TO 5 TABLETS BY MOUTH ONCE DAILY AS NEEDED 04/11/19  Yes [provider]  topiramate (TOPAMAX) 50 MG tablet Take 100 mg by mouth every evening.   Yes [provider]  triamcinolone (KENALOG) 0.1 % Apply 1 application topically 2 (two) times daily. 08/07/20  Yes 04/13/19, PA-C  Nutritional Supplements (LIVER DEFENSE) TABS Take by mouth daily.    [provider]  solifenacin (VESICARE) 10 MG tablet Take 10 mg by mouth daily.    [provider]  traMADol (ULTRAM) 50 MG tablet Take 1-2 tablets (50-100 mg total) by mouth every 6 (six) hours as needed for moderate pain. 09/30/19   Rhys Martini, MD    Family History Family History  Problem Relation Age of Onset  . Healthy Mother   .  Healthy Father   . Lung cancer Maternal Grandfather     Social History Social History   Tobacco Use  . Smoking status: Never Smoker  . Smokeless tobacco: Never Used  Vaping Use  . Vaping Use: Never used  Substance Use Topics  . Alcohol use: Not Currently    Comment: occasional  . Drug use: Never     Allergies   Patient has no known allergies.   Review of Systems Review of Systems  Musculoskeletal:       L 3rd toe pain   All other systems reviewed and are negative.    Physical Exam Triage Vital Signs ED Triage Vitals  Enc Vitals Group     BP 08/07/20 1802 114/75     Pulse Rate 08/07/20  1802 70     Resp 08/07/20 1802 18     Temp 08/07/20 1802 98.8 F (37.1 C)     Temp Source 08/07/20 1802 Oral     SpO2 08/07/20 1802 100 %     Weight --      Height --      Head Circumference --      Peak Flow --      Pain Score 08/07/20 1758 6     Pain Loc --      Pain Edu? --      Excl. in GC? --    No data found.  Updated Vital Signs BP 114/75 (BP Location: Right Arm)   Pulse 70   Temp 98.8 F (37.1 C) (Oral)   Resp 18   SpO2 100%   Visual Acuity Right Eye Distance:   Left Eye Distance:   Bilateral Distance:    Right Eye Near:   Left Eye Near:    Bilateral Near:     Physical Exam Vitals reviewed.  Constitutional:      Appearance: Normal appearance.  Cardiovascular:     Rate and Rhythm: Normal rate and regular rhythm.     Heart sounds: Normal heart sounds.  Pulmonary:     Effort: Pulmonary effort is normal.     Breath sounds: Normal breath sounds.  Skin:    Comments: L 3rd toe without nail. No discharge, erythema. Distal phalanx mildly TTP. ROM not intact due to hemiparesis.   Neurological:     Mental Status: He is alert.     Comments: L hemiparesis at baseline.   Psychiatric:        Mood and Affect: Mood normal.        Behavior: Behavior normal.        Thought Content: Thought content normal.        Judgment: Judgment normal.         UC Treatments / Results  Labs (all labs ordered are listed, but only abnormal results are displayed) Labs Reviewed - No data to display  EKG   Radiology No results found.  Procedures Procedures (including critical care time)  Medications Ordered in UC Medications - No data to display  Initial Impression / Assessment and Plan / UC Course  I have reviewed the triage vital signs and the nursing notes.  Pertinent labs & imaging results that were available during my care of the patient were reviewed by me and considered in my medical decision making (see chart for details).     This patient is a  40 year old male presenting with L 3rd toe irritation. Has been followed by podiatry in the past due to long history of ingrown toenails and  toe pain, following CVA and residual L hemiparesis.   Exam is benign today. Triamcinolone cream sent at pt request. Rec toe pad or cushion, OTC.   F/u with podiatry- information provided.  Spent over 40 minutes obtaining H&P, performing physical, discussing results, treatment plan and plan for follow-up with patient. Patient agrees with plan.    Final Clinical Impressions(s) / UC Diagnoses   Final diagnoses:  Hemiparesis of right dominant side as late effect of cerebral infarction (HCC)  Pain of toe of left foot     Discharge Instructions     -Start the triamcinolone cream, applied to toe 1-2x daily.  -Wrap toe using toe pillow or similar, purchased OTC at pharmacy.  -Establish care with new podiatrist. Information below.  -Head straight to ED if new onset of weakness, worst headache of life, chest pain, etc.     ED Prescriptions    Medication Sig Dispense Auth. Provider   triamcinolone (KENALOG) 0.1 % Apply 1 application topically 2 (two) times daily. 30 g Rhys Martini, PA-C     PDMP not reviewed this encounter.   Rhys Martini, PA-C 08/07/20 2033

## 2020-08-07 NOTE — ED Triage Notes (Signed)
Pt presents with pain to left 4th toe x 1 month. Pain 6/10. Denies injury.

## 2021-07-12 DIAGNOSIS — G4733 Obstructive sleep apnea (adult) (pediatric): Secondary | ICD-10-CM | POA: Diagnosis not present

## 2021-07-31 DIAGNOSIS — M79642 Pain in left hand: Secondary | ICD-10-CM | POA: Diagnosis not present

## 2021-07-31 DIAGNOSIS — Z8673 Personal history of transient ischemic attack (TIA), and cerebral infarction without residual deficits: Secondary | ICD-10-CM | POA: Diagnosis not present

## 2021-07-31 DIAGNOSIS — M62838 Other muscle spasm: Secondary | ICD-10-CM | POA: Diagnosis not present

## 2021-08-12 DIAGNOSIS — G4733 Obstructive sleep apnea (adult) (pediatric): Secondary | ICD-10-CM | POA: Diagnosis not present

## 2021-08-20 DIAGNOSIS — I69354 Hemiplegia and hemiparesis following cerebral infarction affecting left non-dominant side: Secondary | ICD-10-CM | POA: Diagnosis not present

## 2021-08-20 DIAGNOSIS — I69398 Other sequelae of cerebral infarction: Secondary | ICD-10-CM | POA: Diagnosis not present

## 2021-08-20 DIAGNOSIS — R252 Cramp and spasm: Secondary | ICD-10-CM | POA: Diagnosis not present

## 2021-08-20 DIAGNOSIS — I69392 Facial weakness following cerebral infarction: Secondary | ICD-10-CM | POA: Diagnosis not present

## 2021-09-23 DIAGNOSIS — Z6841 Body Mass Index (BMI) 40.0 and over, adult: Secondary | ICD-10-CM | POA: Diagnosis not present

## 2021-09-23 DIAGNOSIS — Z713 Dietary counseling and surveillance: Secondary | ICD-10-CM | POA: Diagnosis not present

## 2021-09-26 DIAGNOSIS — Z7409 Other reduced mobility: Secondary | ICD-10-CM | POA: Diagnosis not present

## 2021-09-26 DIAGNOSIS — I6930 Unspecified sequelae of cerebral infarction: Secondary | ICD-10-CM | POA: Diagnosis not present

## 2021-09-26 DIAGNOSIS — G8114 Spastic hemiplegia affecting left nondominant side: Secondary | ICD-10-CM | POA: Diagnosis not present

## 2021-10-10 DIAGNOSIS — I6930 Unspecified sequelae of cerebral infarction: Secondary | ICD-10-CM | POA: Diagnosis not present

## 2021-10-10 DIAGNOSIS — Z6841 Body Mass Index (BMI) 40.0 and over, adult: Secondary | ICD-10-CM | POA: Diagnosis not present

## 2021-10-10 DIAGNOSIS — G4733 Obstructive sleep apnea (adult) (pediatric): Secondary | ICD-10-CM | POA: Diagnosis not present

## 2021-10-24 DIAGNOSIS — I6930 Unspecified sequelae of cerebral infarction: Secondary | ICD-10-CM | POA: Diagnosis not present

## 2021-10-24 DIAGNOSIS — Z0000 Encounter for general adult medical examination without abnormal findings: Secondary | ICD-10-CM | POA: Diagnosis not present

## 2021-10-24 DIAGNOSIS — M21372 Foot drop, left foot: Secondary | ICD-10-CM | POA: Diagnosis not present

## 2021-10-24 DIAGNOSIS — E785 Hyperlipidemia, unspecified: Secondary | ICD-10-CM | POA: Diagnosis not present

## 2021-10-25 DIAGNOSIS — Z789 Other specified health status: Secondary | ICD-10-CM | POA: Diagnosis not present

## 2021-10-25 DIAGNOSIS — R29898 Other symptoms and signs involving the musculoskeletal system: Secondary | ICD-10-CM | POA: Diagnosis not present

## 2021-10-25 DIAGNOSIS — R279 Unspecified lack of coordination: Secondary | ICD-10-CM | POA: Diagnosis not present

## 2021-10-25 DIAGNOSIS — I69398 Other sequelae of cerebral infarction: Secondary | ICD-10-CM | POA: Diagnosis not present

## 2021-10-25 DIAGNOSIS — R252 Cramp and spasm: Secondary | ICD-10-CM | POA: Diagnosis not present

## 2021-11-04 DIAGNOSIS — Z7409 Other reduced mobility: Secondary | ICD-10-CM | POA: Diagnosis not present

## 2021-11-04 DIAGNOSIS — I6930 Unspecified sequelae of cerebral infarction: Secondary | ICD-10-CM | POA: Diagnosis not present

## 2021-11-18 DIAGNOSIS — I6930 Unspecified sequelae of cerebral infarction: Secondary | ICD-10-CM | POA: Diagnosis not present

## 2021-11-18 DIAGNOSIS — R29898 Other symptoms and signs involving the musculoskeletal system: Secondary | ICD-10-CM | POA: Diagnosis not present

## 2021-11-18 DIAGNOSIS — R252 Cramp and spasm: Secondary | ICD-10-CM | POA: Diagnosis not present

## 2021-11-18 DIAGNOSIS — I69398 Other sequelae of cerebral infarction: Secondary | ICD-10-CM | POA: Diagnosis not present

## 2021-12-18 DIAGNOSIS — R269 Unspecified abnormalities of gait and mobility: Secondary | ICD-10-CM | POA: Diagnosis not present

## 2021-12-18 DIAGNOSIS — I6930 Unspecified sequelae of cerebral infarction: Secondary | ICD-10-CM | POA: Diagnosis not present

## 2021-12-18 DIAGNOSIS — R252 Cramp and spasm: Secondary | ICD-10-CM | POA: Diagnosis not present

## 2021-12-18 DIAGNOSIS — I69354 Hemiplegia and hemiparesis following cerebral infarction affecting left non-dominant side: Secondary | ICD-10-CM | POA: Diagnosis not present

## 2021-12-19 DIAGNOSIS — Z713 Dietary counseling and surveillance: Secondary | ICD-10-CM | POA: Diagnosis not present

## 2021-12-19 DIAGNOSIS — I6930 Unspecified sequelae of cerebral infarction: Secondary | ICD-10-CM | POA: Diagnosis not present

## 2021-12-19 DIAGNOSIS — G4733 Obstructive sleep apnea (adult) (pediatric): Secondary | ICD-10-CM | POA: Diagnosis not present

## 2021-12-19 DIAGNOSIS — E669 Obesity, unspecified: Secondary | ICD-10-CM | POA: Diagnosis not present

## 2022-01-03 DIAGNOSIS — G8114 Spastic hemiplegia affecting left nondominant side: Secondary | ICD-10-CM | POA: Diagnosis not present

## 2022-01-09 DIAGNOSIS — I69398 Other sequelae of cerebral infarction: Secondary | ICD-10-CM | POA: Diagnosis not present

## 2022-01-09 DIAGNOSIS — I6930 Unspecified sequelae of cerebral infarction: Secondary | ICD-10-CM | POA: Diagnosis not present

## 2022-01-09 DIAGNOSIS — Z789 Other specified health status: Secondary | ICD-10-CM | POA: Diagnosis not present

## 2022-01-09 DIAGNOSIS — R29898 Other symptoms and signs involving the musculoskeletal system: Secondary | ICD-10-CM | POA: Diagnosis not present

## 2022-01-09 DIAGNOSIS — R252 Cramp and spasm: Secondary | ICD-10-CM | POA: Diagnosis not present

## 2022-01-24 DIAGNOSIS — I69398 Other sequelae of cerebral infarction: Secondary | ICD-10-CM | POA: Diagnosis not present

## 2022-01-24 DIAGNOSIS — Z789 Other specified health status: Secondary | ICD-10-CM | POA: Diagnosis not present

## 2022-01-24 DIAGNOSIS — I6930 Unspecified sequelae of cerebral infarction: Secondary | ICD-10-CM | POA: Diagnosis not present

## 2022-01-24 DIAGNOSIS — R252 Cramp and spasm: Secondary | ICD-10-CM | POA: Diagnosis not present

## 2022-01-24 DIAGNOSIS — R279 Unspecified lack of coordination: Secondary | ICD-10-CM | POA: Diagnosis not present

## 2022-01-24 DIAGNOSIS — R29898 Other symptoms and signs involving the musculoskeletal system: Secondary | ICD-10-CM | POA: Diagnosis not present

## 2022-02-14 DIAGNOSIS — R252 Cramp and spasm: Secondary | ICD-10-CM | POA: Diagnosis not present

## 2022-02-19 DIAGNOSIS — G4733 Obstructive sleep apnea (adult) (pediatric): Secondary | ICD-10-CM | POA: Diagnosis not present

## 2022-02-19 DIAGNOSIS — I6930 Unspecified sequelae of cerebral infarction: Secondary | ICD-10-CM | POA: Diagnosis not present

## 2022-02-19 DIAGNOSIS — Z6841 Body Mass Index (BMI) 40.0 and over, adult: Secondary | ICD-10-CM | POA: Diagnosis not present

## 2022-04-05 DIAGNOSIS — M25511 Pain in right shoulder: Secondary | ICD-10-CM | POA: Diagnosis not present

## 2022-04-09 DIAGNOSIS — L84 Corns and callosities: Secondary | ICD-10-CM | POA: Diagnosis not present

## 2022-04-09 DIAGNOSIS — L600 Ingrowing nail: Secondary | ICD-10-CM | POA: Diagnosis not present

## 2022-04-09 DIAGNOSIS — M2042 Other hammer toe(s) (acquired), left foot: Secondary | ICD-10-CM | POA: Diagnosis not present

## 2022-04-11 DIAGNOSIS — I69354 Hemiplegia and hemiparesis following cerebral infarction affecting left non-dominant side: Secondary | ICD-10-CM | POA: Diagnosis not present

## 2022-04-11 DIAGNOSIS — G8114 Spastic hemiplegia affecting left nondominant side: Secondary | ICD-10-CM | POA: Diagnosis not present

## 2022-05-07 DIAGNOSIS — G4733 Obstructive sleep apnea (adult) (pediatric): Secondary | ICD-10-CM | POA: Diagnosis not present

## 2022-05-07 DIAGNOSIS — E669 Obesity, unspecified: Secondary | ICD-10-CM | POA: Diagnosis not present

## 2022-05-07 DIAGNOSIS — Z79899 Other long term (current) drug therapy: Secondary | ICD-10-CM | POA: Diagnosis not present

## 2022-05-07 DIAGNOSIS — Z6841 Body Mass Index (BMI) 40.0 and over, adult: Secondary | ICD-10-CM | POA: Diagnosis not present

## 2022-06-18 DIAGNOSIS — G4733 Obstructive sleep apnea (adult) (pediatric): Secondary | ICD-10-CM | POA: Diagnosis not present

## 2022-06-18 DIAGNOSIS — Z6841 Body Mass Index (BMI) 40.0 and over, adult: Secondary | ICD-10-CM | POA: Diagnosis not present

## 2022-06-18 DIAGNOSIS — R632 Polyphagia: Secondary | ICD-10-CM | POA: Diagnosis not present

## 2022-07-11 DIAGNOSIS — G8114 Spastic hemiplegia affecting left nondominant side: Secondary | ICD-10-CM | POA: Diagnosis not present

## 2022-07-11 DIAGNOSIS — Z9889 Other specified postprocedural states: Secondary | ICD-10-CM | POA: Diagnosis not present

## 2022-07-11 DIAGNOSIS — I69354 Hemiplegia and hemiparesis following cerebral infarction affecting left non-dominant side: Secondary | ICD-10-CM | POA: Diagnosis not present

## 2022-07-16 DIAGNOSIS — Z20822 Contact with and (suspected) exposure to covid-19: Secondary | ICD-10-CM | POA: Diagnosis not present

## 2022-07-16 DIAGNOSIS — J069 Acute upper respiratory infection, unspecified: Secondary | ICD-10-CM | POA: Diagnosis not present

## 2022-07-16 DIAGNOSIS — R509 Fever, unspecified: Secondary | ICD-10-CM | POA: Diagnosis not present

## 2022-08-29 DIAGNOSIS — K080 Exfoliation of teeth due to systemic causes: Secondary | ICD-10-CM | POA: Diagnosis not present

## 2022-09-17 DIAGNOSIS — F4320 Adjustment disorder, unspecified: Secondary | ICD-10-CM | POA: Diagnosis not present

## 2022-10-10 DIAGNOSIS — I69354 Hemiplegia and hemiparesis following cerebral infarction affecting left non-dominant side: Secondary | ICD-10-CM | POA: Diagnosis not present

## 2022-10-14 DIAGNOSIS — R3915 Urgency of urination: Secondary | ICD-10-CM | POA: Diagnosis not present

## 2022-10-14 DIAGNOSIS — F5221 Male erectile disorder: Secondary | ICD-10-CM | POA: Diagnosis not present

## 2022-10-23 DIAGNOSIS — Z125 Encounter for screening for malignant neoplasm of prostate: Secondary | ICD-10-CM | POA: Diagnosis not present

## 2022-10-23 DIAGNOSIS — Z0000 Encounter for general adult medical examination without abnormal findings: Secondary | ICD-10-CM | POA: Diagnosis not present

## 2022-10-23 DIAGNOSIS — R7301 Impaired fasting glucose: Secondary | ICD-10-CM | POA: Diagnosis not present

## 2022-10-23 DIAGNOSIS — E785 Hyperlipidemia, unspecified: Secondary | ICD-10-CM | POA: Diagnosis not present

## 2022-10-28 DIAGNOSIS — F4320 Adjustment disorder, unspecified: Secondary | ICD-10-CM | POA: Diagnosis not present

## 2022-10-30 ENCOUNTER — Other Ambulatory Visit (HOSPITAL_BASED_OUTPATIENT_CLINIC_OR_DEPARTMENT_OTHER): Payer: Self-pay

## 2022-10-30 DIAGNOSIS — E785 Hyperlipidemia, unspecified: Secondary | ICD-10-CM | POA: Diagnosis not present

## 2022-10-30 DIAGNOSIS — G8194 Hemiplegia, unspecified affecting left nondominant side: Secondary | ICD-10-CM | POA: Diagnosis not present

## 2022-10-30 DIAGNOSIS — I6930 Unspecified sequelae of cerebral infarction: Secondary | ICD-10-CM | POA: Diagnosis not present

## 2022-10-30 DIAGNOSIS — Z0000 Encounter for general adult medical examination without abnormal findings: Secondary | ICD-10-CM | POA: Diagnosis not present

## 2022-10-30 DIAGNOSIS — Z23 Encounter for immunization: Secondary | ICD-10-CM | POA: Diagnosis not present

## 2022-10-31 DIAGNOSIS — F4320 Adjustment disorder, unspecified: Secondary | ICD-10-CM | POA: Diagnosis not present

## 2022-11-05 DIAGNOSIS — G4733 Obstructive sleep apnea (adult) (pediatric): Secondary | ICD-10-CM | POA: Diagnosis not present

## 2022-11-05 DIAGNOSIS — Z713 Dietary counseling and surveillance: Secondary | ICD-10-CM | POA: Diagnosis not present

## 2022-11-05 DIAGNOSIS — Z6841 Body Mass Index (BMI) 40.0 and over, adult: Secondary | ICD-10-CM | POA: Diagnosis not present

## 2022-11-05 DIAGNOSIS — F5081 Binge eating disorder: Secondary | ICD-10-CM | POA: Diagnosis not present

## 2022-11-11 DIAGNOSIS — F4320 Adjustment disorder, unspecified: Secondary | ICD-10-CM | POA: Diagnosis not present

## 2022-11-14 DIAGNOSIS — F4320 Adjustment disorder, unspecified: Secondary | ICD-10-CM | POA: Diagnosis not present

## 2022-11-25 DIAGNOSIS — F4320 Adjustment disorder, unspecified: Secondary | ICD-10-CM | POA: Diagnosis not present

## 2022-11-26 ENCOUNTER — Ambulatory Visit (HOSPITAL_COMMUNITY)
Admission: RE | Admit: 2022-11-26 | Discharge: 2022-11-26 | Disposition: A | Discharge status: Home / Self Care | Payer: Federal, State, Local not specified - PPO | Source: Ambulatory Visit

## 2022-11-26 DIAGNOSIS — E785 Hyperlipidemia, unspecified: Secondary | ICD-10-CM | POA: Insufficient documentation

## 2022-12-17 DIAGNOSIS — G8194 Hemiplegia, unspecified affecting left nondominant side: Secondary | ICD-10-CM | POA: Diagnosis not present

## 2022-12-17 DIAGNOSIS — I6930 Unspecified sequelae of cerebral infarction: Secondary | ICD-10-CM | POA: Diagnosis not present

## 2022-12-26 DIAGNOSIS — M21372 Foot drop, left foot: Secondary | ICD-10-CM | POA: Diagnosis not present

## 2022-12-26 DIAGNOSIS — I639 Cerebral infarction, unspecified: Secondary | ICD-10-CM | POA: Diagnosis not present

## 2022-12-26 DIAGNOSIS — G8194 Hemiplegia, unspecified affecting left nondominant side: Secondary | ICD-10-CM | POA: Diagnosis not present

## 2022-12-30 DIAGNOSIS — G8114 Spastic hemiplegia affecting left nondominant side: Secondary | ICD-10-CM | POA: Diagnosis not present

## 2022-12-30 DIAGNOSIS — I6789 Other cerebrovascular disease: Secondary | ICD-10-CM | POA: Diagnosis not present

## 2023-01-16 DIAGNOSIS — Z79899 Other long term (current) drug therapy: Secondary | ICD-10-CM | POA: Diagnosis not present

## 2023-01-16 DIAGNOSIS — G8114 Spastic hemiplegia affecting left nondominant side: Secondary | ICD-10-CM | POA: Diagnosis not present

## 2023-02-17 DIAGNOSIS — R279 Unspecified lack of coordination: Secondary | ICD-10-CM | POA: Diagnosis not present

## 2023-02-17 DIAGNOSIS — I69954 Hemiplegia and hemiparesis following unspecified cerebrovascular disease affecting left non-dominant side: Secondary | ICD-10-CM | POA: Diagnosis not present

## 2023-02-17 DIAGNOSIS — G8114 Spastic hemiplegia affecting left nondominant side: Secondary | ICD-10-CM | POA: Diagnosis not present

## 2023-02-17 DIAGNOSIS — Z789 Other specified health status: Secondary | ICD-10-CM | POA: Diagnosis not present

## 2023-02-17 DIAGNOSIS — Z7409 Other reduced mobility: Secondary | ICD-10-CM | POA: Diagnosis not present

## 2023-02-23 DIAGNOSIS — F4320 Adjustment disorder, unspecified: Secondary | ICD-10-CM | POA: Diagnosis not present

## 2023-03-02 DIAGNOSIS — F4320 Adjustment disorder, unspecified: Secondary | ICD-10-CM | POA: Diagnosis not present

## 2023-03-09 DIAGNOSIS — R7303 Prediabetes: Secondary | ICD-10-CM | POA: Diagnosis not present

## 2023-03-09 DIAGNOSIS — Z23 Encounter for immunization: Secondary | ICD-10-CM | POA: Diagnosis not present

## 2023-03-09 DIAGNOSIS — F4320 Adjustment disorder, unspecified: Secondary | ICD-10-CM | POA: Diagnosis not present

## 2023-03-09 DIAGNOSIS — R7301 Impaired fasting glucose: Secondary | ICD-10-CM | POA: Diagnosis not present

## 2023-03-09 DIAGNOSIS — G44209 Tension-type headache, unspecified, not intractable: Secondary | ICD-10-CM | POA: Diagnosis not present

## 2023-03-09 DIAGNOSIS — R42 Dizziness and giddiness: Secondary | ICD-10-CM | POA: Diagnosis not present

## 2023-03-13 DIAGNOSIS — F4320 Adjustment disorder, unspecified: Secondary | ICD-10-CM | POA: Diagnosis not present

## 2023-03-16 DIAGNOSIS — F4320 Adjustment disorder, unspecified: Secondary | ICD-10-CM | POA: Diagnosis not present

## 2023-03-31 DIAGNOSIS — Z6841 Body Mass Index (BMI) 40.0 and over, adult: Secondary | ICD-10-CM | POA: Diagnosis not present

## 2023-03-31 DIAGNOSIS — Z713 Dietary counseling and surveillance: Secondary | ICD-10-CM | POA: Diagnosis not present

## 2023-03-31 DIAGNOSIS — Z79899 Other long term (current) drug therapy: Secondary | ICD-10-CM | POA: Diagnosis not present

## 2023-04-15 DIAGNOSIS — G8114 Spastic hemiplegia affecting left nondominant side: Secondary | ICD-10-CM | POA: Diagnosis not present

## 2023-04-15 DIAGNOSIS — R29898 Other symptoms and signs involving the musculoskeletal system: Secondary | ICD-10-CM | POA: Diagnosis not present

## 2023-04-15 DIAGNOSIS — Z7409 Other reduced mobility: Secondary | ICD-10-CM | POA: Diagnosis not present

## 2023-04-15 DIAGNOSIS — R279 Unspecified lack of coordination: Secondary | ICD-10-CM | POA: Diagnosis not present

## 2023-04-15 DIAGNOSIS — I6930 Unspecified sequelae of cerebral infarction: Secondary | ICD-10-CM | POA: Diagnosis not present

## 2023-04-15 DIAGNOSIS — G8190 Hemiplegia, unspecified affecting unspecified side: Secondary | ICD-10-CM | POA: Diagnosis not present

## 2023-04-15 DIAGNOSIS — Z789 Other specified health status: Secondary | ICD-10-CM | POA: Diagnosis not present

## 2023-04-15 DIAGNOSIS — I69954 Hemiplegia and hemiparesis following unspecified cerebrovascular disease affecting left non-dominant side: Secondary | ICD-10-CM | POA: Diagnosis not present

## 2023-04-22 DIAGNOSIS — R29898 Other symptoms and signs involving the musculoskeletal system: Secondary | ICD-10-CM | POA: Diagnosis not present

## 2023-04-22 DIAGNOSIS — Z789 Other specified health status: Secondary | ICD-10-CM | POA: Diagnosis not present

## 2023-04-22 DIAGNOSIS — G8114 Spastic hemiplegia affecting left nondominant side: Secondary | ICD-10-CM | POA: Diagnosis not present

## 2023-04-22 DIAGNOSIS — I6930 Unspecified sequelae of cerebral infarction: Secondary | ICD-10-CM | POA: Diagnosis not present

## 2023-04-22 DIAGNOSIS — Z7409 Other reduced mobility: Secondary | ICD-10-CM | POA: Diagnosis not present

## 2023-04-22 DIAGNOSIS — I69398 Other sequelae of cerebral infarction: Secondary | ICD-10-CM | POA: Diagnosis not present

## 2023-04-22 DIAGNOSIS — R252 Cramp and spasm: Secondary | ICD-10-CM | POA: Diagnosis not present

## 2023-04-22 DIAGNOSIS — G8190 Hemiplegia, unspecified affecting unspecified side: Secondary | ICD-10-CM | POA: Diagnosis not present

## 2023-04-22 DIAGNOSIS — I69954 Hemiplegia and hemiparesis following unspecified cerebrovascular disease affecting left non-dominant side: Secondary | ICD-10-CM | POA: Diagnosis not present

## 2023-04-22 DIAGNOSIS — R279 Unspecified lack of coordination: Secondary | ICD-10-CM | POA: Diagnosis not present

## 2023-04-24 DIAGNOSIS — Z79899 Other long term (current) drug therapy: Secondary | ICD-10-CM | POA: Diagnosis not present

## 2023-04-24 DIAGNOSIS — G8114 Spastic hemiplegia affecting left nondominant side: Secondary | ICD-10-CM | POA: Diagnosis not present

## 2023-04-30 DIAGNOSIS — R7301 Impaired fasting glucose: Secondary | ICD-10-CM | POA: Diagnosis not present

## 2023-04-30 DIAGNOSIS — E785 Hyperlipidemia, unspecified: Secondary | ICD-10-CM | POA: Diagnosis not present

## 2023-05-01 DIAGNOSIS — I69398 Other sequelae of cerebral infarction: Secondary | ICD-10-CM | POA: Diagnosis not present

## 2023-05-01 DIAGNOSIS — G8114 Spastic hemiplegia affecting left nondominant side: Secondary | ICD-10-CM | POA: Diagnosis not present

## 2023-05-01 DIAGNOSIS — I6930 Unspecified sequelae of cerebral infarction: Secondary | ICD-10-CM | POA: Diagnosis not present

## 2023-05-01 DIAGNOSIS — Z7409 Other reduced mobility: Secondary | ICD-10-CM | POA: Diagnosis not present

## 2023-05-01 DIAGNOSIS — R252 Cramp and spasm: Secondary | ICD-10-CM | POA: Diagnosis not present

## 2023-05-01 DIAGNOSIS — Z789 Other specified health status: Secondary | ICD-10-CM | POA: Diagnosis not present

## 2023-05-01 DIAGNOSIS — R279 Unspecified lack of coordination: Secondary | ICD-10-CM | POA: Diagnosis not present

## 2023-05-01 DIAGNOSIS — I69954 Hemiplegia and hemiparesis following unspecified cerebrovascular disease affecting left non-dominant side: Secondary | ICD-10-CM | POA: Diagnosis not present

## 2023-05-01 DIAGNOSIS — R29898 Other symptoms and signs involving the musculoskeletal system: Secondary | ICD-10-CM | POA: Diagnosis not present

## 2023-05-01 DIAGNOSIS — G8190 Hemiplegia, unspecified affecting unspecified side: Secondary | ICD-10-CM | POA: Diagnosis not present

## 2023-05-20 DIAGNOSIS — R279 Unspecified lack of coordination: Secondary | ICD-10-CM | POA: Diagnosis not present

## 2023-05-20 DIAGNOSIS — R29898 Other symptoms and signs involving the musculoskeletal system: Secondary | ICD-10-CM | POA: Diagnosis not present

## 2023-05-20 DIAGNOSIS — Z789 Other specified health status: Secondary | ICD-10-CM | POA: Diagnosis not present

## 2023-05-20 DIAGNOSIS — I6930 Unspecified sequelae of cerebral infarction: Secondary | ICD-10-CM | POA: Diagnosis not present

## 2023-05-20 DIAGNOSIS — I69954 Hemiplegia and hemiparesis following unspecified cerebrovascular disease affecting left non-dominant side: Secondary | ICD-10-CM | POA: Diagnosis not present

## 2023-05-20 DIAGNOSIS — G8190 Hemiplegia, unspecified affecting unspecified side: Secondary | ICD-10-CM | POA: Diagnosis not present

## 2023-05-20 DIAGNOSIS — I69398 Other sequelae of cerebral infarction: Secondary | ICD-10-CM | POA: Diagnosis not present

## 2023-05-20 DIAGNOSIS — Z7409 Other reduced mobility: Secondary | ICD-10-CM | POA: Diagnosis not present

## 2023-05-20 DIAGNOSIS — G8114 Spastic hemiplegia affecting left nondominant side: Secondary | ICD-10-CM | POA: Diagnosis not present

## 2023-05-20 DIAGNOSIS — R252 Cramp and spasm: Secondary | ICD-10-CM | POA: Diagnosis not present

## 2023-05-27 DIAGNOSIS — Z789 Other specified health status: Secondary | ICD-10-CM | POA: Diagnosis not present

## 2023-05-27 DIAGNOSIS — G8190 Hemiplegia, unspecified affecting unspecified side: Secondary | ICD-10-CM | POA: Diagnosis not present

## 2023-05-27 DIAGNOSIS — I69398 Other sequelae of cerebral infarction: Secondary | ICD-10-CM | POA: Diagnosis not present

## 2023-05-27 DIAGNOSIS — G8114 Spastic hemiplegia affecting left nondominant side: Secondary | ICD-10-CM | POA: Diagnosis not present

## 2023-05-27 DIAGNOSIS — R29898 Other symptoms and signs involving the musculoskeletal system: Secondary | ICD-10-CM | POA: Diagnosis not present

## 2023-05-27 DIAGNOSIS — Z7409 Other reduced mobility: Secondary | ICD-10-CM | POA: Diagnosis not present

## 2023-05-27 DIAGNOSIS — I6930 Unspecified sequelae of cerebral infarction: Secondary | ICD-10-CM | POA: Diagnosis not present

## 2023-05-27 DIAGNOSIS — R279 Unspecified lack of coordination: Secondary | ICD-10-CM | POA: Diagnosis not present

## 2023-05-27 DIAGNOSIS — R252 Cramp and spasm: Secondary | ICD-10-CM | POA: Diagnosis not present

## 2023-05-27 DIAGNOSIS — I69954 Hemiplegia and hemiparesis following unspecified cerebrovascular disease affecting left non-dominant side: Secondary | ICD-10-CM | POA: Diagnosis not present

## 2023-06-02 DIAGNOSIS — I6930 Unspecified sequelae of cerebral infarction: Secondary | ICD-10-CM | POA: Diagnosis not present

## 2023-06-02 DIAGNOSIS — G8194 Hemiplegia, unspecified affecting left nondominant side: Secondary | ICD-10-CM | POA: Diagnosis not present

## 2023-06-02 DIAGNOSIS — M21372 Foot drop, left foot: Secondary | ICD-10-CM | POA: Diagnosis not present

## 2023-06-02 DIAGNOSIS — Z6841 Body Mass Index (BMI) 40.0 and over, adult: Secondary | ICD-10-CM | POA: Diagnosis not present

## 2023-06-02 DIAGNOSIS — Z79899 Other long term (current) drug therapy: Secondary | ICD-10-CM | POA: Diagnosis not present

## 2023-06-02 DIAGNOSIS — Z713 Dietary counseling and surveillance: Secondary | ICD-10-CM | POA: Diagnosis not present

## 2023-06-02 DIAGNOSIS — E785 Hyperlipidemia, unspecified: Secondary | ICD-10-CM | POA: Diagnosis not present

## 2023-06-03 DIAGNOSIS — G8114 Spastic hemiplegia affecting left nondominant side: Secondary | ICD-10-CM | POA: Diagnosis not present

## 2023-06-03 DIAGNOSIS — M21372 Foot drop, left foot: Secondary | ICD-10-CM | POA: Diagnosis not present

## 2023-06-19 DIAGNOSIS — R279 Unspecified lack of coordination: Secondary | ICD-10-CM | POA: Diagnosis not present

## 2023-06-19 DIAGNOSIS — Z789 Other specified health status: Secondary | ICD-10-CM | POA: Diagnosis not present

## 2023-06-19 DIAGNOSIS — I69954 Hemiplegia and hemiparesis following unspecified cerebrovascular disease affecting left non-dominant side: Secondary | ICD-10-CM | POA: Diagnosis not present

## 2023-06-19 DIAGNOSIS — I6930 Unspecified sequelae of cerebral infarction: Secondary | ICD-10-CM | POA: Diagnosis not present

## 2023-06-19 DIAGNOSIS — G8190 Hemiplegia, unspecified affecting unspecified side: Secondary | ICD-10-CM | POA: Diagnosis not present

## 2023-06-19 DIAGNOSIS — Z7409 Other reduced mobility: Secondary | ICD-10-CM | POA: Diagnosis not present

## 2023-06-19 DIAGNOSIS — G8114 Spastic hemiplegia affecting left nondominant side: Secondary | ICD-10-CM | POA: Diagnosis not present

## 2023-06-19 DIAGNOSIS — R252 Cramp and spasm: Secondary | ICD-10-CM | POA: Diagnosis not present

## 2023-06-19 DIAGNOSIS — I69398 Other sequelae of cerebral infarction: Secondary | ICD-10-CM | POA: Diagnosis not present

## 2023-06-19 DIAGNOSIS — R29898 Other symptoms and signs involving the musculoskeletal system: Secondary | ICD-10-CM | POA: Diagnosis not present

## 2023-06-24 DIAGNOSIS — I69398 Other sequelae of cerebral infarction: Secondary | ICD-10-CM | POA: Diagnosis not present

## 2023-06-24 DIAGNOSIS — G8190 Hemiplegia, unspecified affecting unspecified side: Secondary | ICD-10-CM | POA: Diagnosis not present

## 2023-06-24 DIAGNOSIS — Z7409 Other reduced mobility: Secondary | ICD-10-CM | POA: Diagnosis not present

## 2023-06-24 DIAGNOSIS — R29898 Other symptoms and signs involving the musculoskeletal system: Secondary | ICD-10-CM | POA: Diagnosis not present

## 2023-06-24 DIAGNOSIS — G8114 Spastic hemiplegia affecting left nondominant side: Secondary | ICD-10-CM | POA: Diagnosis not present

## 2023-06-24 DIAGNOSIS — R279 Unspecified lack of coordination: Secondary | ICD-10-CM | POA: Diagnosis not present

## 2023-06-24 DIAGNOSIS — I69954 Hemiplegia and hemiparesis following unspecified cerebrovascular disease affecting left non-dominant side: Secondary | ICD-10-CM | POA: Diagnosis not present

## 2023-06-24 DIAGNOSIS — I6930 Unspecified sequelae of cerebral infarction: Secondary | ICD-10-CM | POA: Diagnosis not present

## 2023-06-24 DIAGNOSIS — Z789 Other specified health status: Secondary | ICD-10-CM | POA: Diagnosis not present

## 2023-06-24 DIAGNOSIS — R252 Cramp and spasm: Secondary | ICD-10-CM | POA: Diagnosis not present

## 2023-07-01 DIAGNOSIS — I69954 Hemiplegia and hemiparesis following unspecified cerebrovascular disease affecting left non-dominant side: Secondary | ICD-10-CM | POA: Diagnosis not present

## 2023-07-01 DIAGNOSIS — R279 Unspecified lack of coordination: Secondary | ICD-10-CM | POA: Diagnosis not present

## 2023-07-01 DIAGNOSIS — I69398 Other sequelae of cerebral infarction: Secondary | ICD-10-CM | POA: Diagnosis not present

## 2023-07-01 DIAGNOSIS — G8114 Spastic hemiplegia affecting left nondominant side: Secondary | ICD-10-CM | POA: Diagnosis not present

## 2023-07-01 DIAGNOSIS — I6930 Unspecified sequelae of cerebral infarction: Secondary | ICD-10-CM | POA: Diagnosis not present

## 2023-07-01 DIAGNOSIS — Z7409 Other reduced mobility: Secondary | ICD-10-CM | POA: Diagnosis not present

## 2023-07-01 DIAGNOSIS — R29898 Other symptoms and signs involving the musculoskeletal system: Secondary | ICD-10-CM | POA: Diagnosis not present

## 2023-07-01 DIAGNOSIS — R252 Cramp and spasm: Secondary | ICD-10-CM | POA: Diagnosis not present

## 2023-07-01 DIAGNOSIS — Z789 Other specified health status: Secondary | ICD-10-CM | POA: Diagnosis not present

## 2023-07-01 DIAGNOSIS — G8190 Hemiplegia, unspecified affecting unspecified side: Secondary | ICD-10-CM | POA: Diagnosis not present

## 2023-07-10 DIAGNOSIS — G4733 Obstructive sleep apnea (adult) (pediatric): Secondary | ICD-10-CM | POA: Diagnosis not present

## 2023-07-15 DIAGNOSIS — R279 Unspecified lack of coordination: Secondary | ICD-10-CM | POA: Diagnosis not present

## 2023-07-15 DIAGNOSIS — I6930 Unspecified sequelae of cerebral infarction: Secondary | ICD-10-CM | POA: Diagnosis not present

## 2023-07-15 DIAGNOSIS — I69954 Hemiplegia and hemiparesis following unspecified cerebrovascular disease affecting left non-dominant side: Secondary | ICD-10-CM | POA: Diagnosis not present

## 2023-07-15 DIAGNOSIS — Z7409 Other reduced mobility: Secondary | ICD-10-CM | POA: Diagnosis not present

## 2023-07-15 DIAGNOSIS — G8114 Spastic hemiplegia affecting left nondominant side: Secondary | ICD-10-CM | POA: Diagnosis not present

## 2023-07-15 DIAGNOSIS — R29898 Other symptoms and signs involving the musculoskeletal system: Secondary | ICD-10-CM | POA: Diagnosis not present

## 2023-07-15 DIAGNOSIS — G8190 Hemiplegia, unspecified affecting unspecified side: Secondary | ICD-10-CM | POA: Diagnosis not present

## 2023-07-15 DIAGNOSIS — Z789 Other specified health status: Secondary | ICD-10-CM | POA: Diagnosis not present

## 2023-07-15 DIAGNOSIS — R252 Cramp and spasm: Secondary | ICD-10-CM | POA: Diagnosis not present

## 2023-07-15 DIAGNOSIS — I69398 Other sequelae of cerebral infarction: Secondary | ICD-10-CM | POA: Diagnosis not present

## 2023-07-31 DIAGNOSIS — G8114 Spastic hemiplegia affecting left nondominant side: Secondary | ICD-10-CM | POA: Diagnosis not present

## 2023-07-31 DIAGNOSIS — Z9889 Other specified postprocedural states: Secondary | ICD-10-CM | POA: Diagnosis not present

## 2023-08-10 DIAGNOSIS — G4733 Obstructive sleep apnea (adult) (pediatric): Secondary | ICD-10-CM | POA: Diagnosis not present

## 2023-08-20 DIAGNOSIS — F5221 Male erectile disorder: Secondary | ICD-10-CM | POA: Diagnosis not present

## 2023-08-20 DIAGNOSIS — R3915 Urgency of urination: Secondary | ICD-10-CM | POA: Diagnosis not present

## 2023-08-20 DIAGNOSIS — R102 Pelvic and perineal pain: Secondary | ICD-10-CM | POA: Diagnosis not present

## 2023-09-07 DIAGNOSIS — G4733 Obstructive sleep apnea (adult) (pediatric): Secondary | ICD-10-CM | POA: Diagnosis not present

## 2023-10-22 ENCOUNTER — Other Ambulatory Visit: Payer: Self-pay | Admitting: Medical Genetics

## 2023-10-29 DIAGNOSIS — J029 Acute pharyngitis, unspecified: Secondary | ICD-10-CM | POA: Diagnosis not present

## 2023-11-03 DIAGNOSIS — G8114 Spastic hemiplegia affecting left nondominant side: Secondary | ICD-10-CM | POA: Diagnosis not present

## 2023-11-26 DIAGNOSIS — Z125 Encounter for screening for malignant neoplasm of prostate: Secondary | ICD-10-CM | POA: Diagnosis not present

## 2023-11-26 DIAGNOSIS — R7303 Prediabetes: Secondary | ICD-10-CM | POA: Diagnosis not present

## 2023-11-26 DIAGNOSIS — E785 Hyperlipidemia, unspecified: Secondary | ICD-10-CM | POA: Diagnosis not present

## 2023-11-27 DIAGNOSIS — G4733 Obstructive sleep apnea (adult) (pediatric): Secondary | ICD-10-CM | POA: Diagnosis not present

## 2023-12-03 DIAGNOSIS — E785 Hyperlipidemia, unspecified: Secondary | ICD-10-CM | POA: Diagnosis not present

## 2023-12-03 DIAGNOSIS — I6930 Unspecified sequelae of cerebral infarction: Secondary | ICD-10-CM | POA: Diagnosis not present

## 2023-12-03 DIAGNOSIS — Z0000 Encounter for general adult medical examination without abnormal findings: Secondary | ICD-10-CM | POA: Diagnosis not present

## 2023-12-03 DIAGNOSIS — Z113 Encounter for screening for infections with a predominantly sexual mode of transmission: Secondary | ICD-10-CM | POA: Diagnosis not present

## 2023-12-03 DIAGNOSIS — G8194 Hemiplegia, unspecified affecting left nondominant side: Secondary | ICD-10-CM | POA: Diagnosis not present

## 2024-01-12 DIAGNOSIS — Z113 Encounter for screening for infections with a predominantly sexual mode of transmission: Secondary | ICD-10-CM | POA: Diagnosis not present

## 2024-02-09 DIAGNOSIS — G8114 Spastic hemiplegia affecting left nondominant side: Secondary | ICD-10-CM | POA: Diagnosis not present

## 2024-03-10 ENCOUNTER — Other Ambulatory Visit

## 2024-04-06 DIAGNOSIS — Z6841 Body Mass Index (BMI) 40.0 and over, adult: Secondary | ICD-10-CM | POA: Diagnosis not present

## 2024-04-06 DIAGNOSIS — Z713 Dietary counseling and surveillance: Secondary | ICD-10-CM | POA: Diagnosis not present

## 2024-04-06 DIAGNOSIS — G4733 Obstructive sleep apnea (adult) (pediatric): Secondary | ICD-10-CM | POA: Diagnosis not present

## 2024-04-27 ENCOUNTER — Institutional Professional Consult (permissible substitution): Admitting: Neurology

## 2024-05-04 ENCOUNTER — Institutional Professional Consult (permissible substitution): Admitting: Neurology

## 2024-05-05 ENCOUNTER — Encounter: Payer: Self-pay | Admitting: Neurology

## 2024-05-05 ENCOUNTER — Ambulatory Visit (INDEPENDENT_AMBULATORY_CARE_PROVIDER_SITE_OTHER): Admitting: Neurology

## 2024-05-05 VITALS — BP 128/75 | HR 72 | Ht 72.0 in | Wt 320.0 lb

## 2024-05-05 DIAGNOSIS — G4733 Obstructive sleep apnea (adult) (pediatric): Secondary | ICD-10-CM | POA: Diagnosis not present

## 2024-05-05 DIAGNOSIS — E6609 Other obesity due to excess calories: Secondary | ICD-10-CM | POA: Diagnosis not present

## 2024-05-05 DIAGNOSIS — I69354 Hemiplegia and hemiparesis following cerebral infarction affecting left non-dominant side: Secondary | ICD-10-CM | POA: Diagnosis not present

## 2024-05-05 NOTE — Patient Instructions (Addendum)
 Living With Sleep Apnea Sleep apnea is a condition that affects your breathing while you're sleeping. Your tongue or the tissue in your throat may block the flow of air while you sleep. You may have shallow breathing or stop breathing for short periods of time. The breaks in breathing interrupt the deep sleep that you need to feel rested. Even if you don't wake up from the gaps in breathing, you may feel tired during the day. People with sleep apnea may snore loudly. You may have a headache in the morning and feel anxious or depressed. How can sleep apnea affect me? Sleep apnea increases your chances of being very tired during the day. This is called daytime fatigue. Sleep apnea can also increase your risk of: Heart attack. Stroke. Obesity. Type 2 diabetes. Heart failure. Irregular heartbeat. High blood pressure. If you are very tired during the day, you may be more likely to: Not do well in school or at work. Fall asleep while driving. Have trouble paying attention. Develop depression or anxiety. Have problems having sex. This is called sexual dysfunction. What actions can I take to manage sleep apnea? Sleep apnea treatment  If you were given a device to open your airway while you sleep, use it only as told by your health care provider. You may be given: An oral appliance. This is a mouthpiece that shifts your lower jaw forward. A continuous positive airway pressure (CPAP) device. This blows air through a mask. A nasal expiratory positive airway pressure (EPAP) device. This has valves that you put into each nostril. A bi-level positive airway pressure (BIPAP) device. This blows air through a mask when you breathe in and breathe out. You may need surgery if other treatments don't work for you. Sleep habits Go to sleep and wake up at the same time every day. This helps set your internal clock for sleeping. If you stay up later than usual on weekends, try to get up in the morning within  2 hours of the time you usually wake up. Try to get at least 7-9 hours of sleep each night. Stop using a computer, tablet, and mobile phone a few hours before bedtime. Do not take long naps during the day. If you nap, limit it to 30 minutes. Have a relaxing bedtime routine. Reading or listening to music may relax you and help you sleep. Use your bedroom only for sleep. Keep your television and computer out of your bedroom. Keep your bedroom cool, dark, and quiet. Use a supportive mattress and pillows. Follow your provider's instructions for other changes to sleep habits. Nutrition Do not eat big meals in the evening. Do not have caffeine in the later part of the day. The effects of caffeine can last for more than 5 hours. Follow your provider's instructions for any changes to what you eat and drink. Lifestyle Do not drink alcohol before bedtime. Alcohol can cause you to fall asleep at first, but then it can cause you to wake up in the middle of the night and have trouble getting back to sleep. Do not smoke, vape, or use nicotine or tobacco. Medicines Take over-the-counter and prescription medicines only as told by your provider. Do not use over-the-counter sleep medicine. You may become dependent on this medicine, and it can make sleep apnea worse. Do not take medicines, such as sedatives and narcotics, unless told to by your provider. Activity Exercise on most days, but avoid exercising in the evening. Exercising near bedtime can interfere with sleeping.  If possible, spend time outside every day. Natural light helps with your internal clock. General information Lose weight if you need to. Stay at a healthy weight. If you are having surgery, make sure to tell your provider that you have sleep apnea. You may need to bring your device with you. Keep all follow-up visits. Your provider will want to check on your condition. Where to find more information National [employer], Lung, and [employer]  Institute: [employer]al[employer]ry[employer]er.gl This information is not intended to replace advice given to you by your health care provider. Make sure you discuss any questions you have with your health care provider. Document Revised: 09/24/2022 Document Reviewed: 09/24/2022 Elsevier Patient Education  2024 [employer]HST/   prefer Watch pat.   I would like to thank Corlis Pagan, NP for allowing me to meet with Travis Mullins again    In short, Travis Mullins is presenting with recurrent hypersomnia, fatigue in spite of using his CPAP. He has more breakthrough snoring but had fresh supplies. His weight has ben regained .  Risk factors for OSA were present,  including : Body mass index is 43.4 kg/m., neck size and upper airway anatomy.  He can definitely benefit from weight loss, considering Zepbound.     My Plan is to proceed with:  HST/   prefer Watch pat.   Follow up with in -lab TITRATION  1) we can first perform a HST to see what the current baseline is, but I am sure there is still severe OSA. we can get the patient back after that for an in lab titration and mask refit.

## 2024-05-05 NOTE — Progress Notes (Signed)
 @[facility]   SLEEP Provider:  Dedra Gores, MD    Primary Care Physician:  Corlis Pagan, NP [address] [place] [address] [place] [address]   Referring Provider: Corlis Pagan, Np [address] [place],  [place] [address]        Chief Concern for this Consultation:   Patient presents with          HPI: I have the pleasure of meeting with Travis Mullins , on 05/05/24 , who is a 43 y.o.  male patient,  wheelchair bound , seen upon a referral by mallie corlis, NP .   for a new Sleep Medicine Consultation.  The patient's referral information : stated the patient has non restorative sleep on CPAP, CPAP machine is now 7 years works well , yet he can't sleep supine - he has airway obstruction and chokes.  Chief concern according to patient:  he had seen me in 2018 and had a stroke at age 39 while playing football,  dissection of the right carotid artery.  He has sleep apnea probably before that, he was always unrefreshed and drowsy in the morning.    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   Sleep relevant medical/ surgical and symptom history: The patient reports onset of symptoms over a time period of the last 3-4 years again, in spite of compliant CPAP use . Sleepiness, fatigue, break through snoring, non restorative sleep  See ROS. Retrognathia, Obesity, hemiparesis,   Trauma such as TBI/ whiplash  2001, thrombosis. Prediabetic, lost 81 pounds in 2021, gained it all back. Mood disorders, Depression.    This patient had a previous sleep study/Sleep study in 2018 : AHI 53/h - and he was excessively daytime sleepy.  CPAP started and  still on the same machine.   Family medical history: There are  biological family members affected by Sleep apnea (father ).    Social history: Travis Mullins is working at the [employer] lives in a private home, alone - GF in [place],  in a  and has no pets (/). One son , age 70 years old.  The patient currently works daytime.   Nicotine use: /.  ETOH use: / rare ,  Caffeine intake in form of: Coffee (/), Soft drinks (3/ week ), Tea ( /) , [employer] drinks ( including those containing  taurine ). Caffeine is last consumed at 10 AM .  Exercises regularly:    Sleep habits  and routines are as follows: The patient's dinner time is around 8 PM.  Evening time is spent by TV . The patient goes to bed at, or close to, 10.30 PM. The bedroom is shared with GF and is described as cool, quiet, and dark.  The patient reports that it takes 30 minutes to fall asleep, then continues to sleep for 6-7 hours, uninterrupted and rarely woken up by  the need to void (Nocturia).   The preferred sleep position is lateral, he chokes in supine- , with support of 3 pillows, (non- adjustable bed/ recliner ). The total estimated sleep time is circa 7-8  hours.  Dreams are reportedly  frequent and can be vivid. Dream enactment has not been reported.   7 AM is the usual week- day rise time. The patient wakes up with several  alarms set at 6 AM .  He reports mostly/ feeling  refreshed and restored in the morning, waking with symptoms such as dry mouth, morning headaches if he sleeps without CPAP  stiffness or pain, and fatigue.  No sleep paralysis has been experienced.  Naps in daytime are taken infrequently (there is a desire to nap and some opportunity), lasting from 60-180 minute  and have a non- refreshing quality.  These do not interfere with nocturnal sleep.    Review of Systems: Out of a complete 14 system review, the patient complains of only the following symptoms, and all other reviewed systems are negative.:  Hypersomnia-   Snoring, Sleep fragmentation, Nocturia   How likely are you to doze in the following situations: 0 = not likely, 1 = slight chance, 2 = moderate chance, 3 = high chance Sitting and Reading? Watching Television? Sitting inactive in a public place (theater or meeting)? As a passenger in a car for an hour without a break? Lying down in the afternoon when circumstances permit? Sitting and talking to someone? Sitting quietly after lunch without alcohol? In a car, while stopped for a few minutes in traffic?   Total ESS =14 / 24 points.    FSS endorsed at 47/ 63 points.     Social History   Socioeconomic History   Marital status: Single    Spouse name: Not on file   Number of children: Not on file   Years of education: Not on file   Highest education level: LSW - works for the [employer]   Occupational History   Not on file  Tobacco Use   Smoking status: Never   Smokeless tobacco: Never  Vaping Use   Vaping status: Never Used  Substance and Sexual Activity   Alcohol use: Not Currently    Comment: occasional   Drug use: Never   Sexual activity: Not on file  Other Topics Concern   Not on file  Social History Narrative   Not on file   Social Drivers of Health   Financial Resource Strain: Not on file  Food Insecurity: Not on file  Transportation Needs: Not on file  Physical Activity: Not on file  Stress: Not on file   Social Connections: Unknown (07/16/2022)   Received from [facility]   Social Network    Social Network: Not on file    Family History  Problem Relation Age of Onset   Healthy Mother    Healthy Father    Lung cancer Maternal Grandfather     Past Medical History:  Diagnosis Date   Arthritis    left ankle   BMI 38.0-38.9,adult  09-22-2019 currently in [facility] management program @[facility]    Hemiparesis affecting left side as late effect of cerebrovascular accident (CVA) (HCC)    2001   History of Bell's palsy    2008---- left side, pt stated resolved   History of CVA with residual deficit (09-22-2019 pt states has abnormal gait with normal balance due to left hemiparesis   2001 (age 18)  football base of skull head injury, 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 w/ left hemiparesis   History of dissection of internal carotid artery followed by vascular on prn basis--- lov note 01-25-2018 in epic w/ dr serene   2001---  football base of skull head injury dissection right ICA w/ occulsion (no surgical intervention)   OSA on CPAP    09-22-2019 per pt uses every night   Urgency of urination     Past Surgical History:  Procedure Laterality Date   CIRCUMCISION  05-27-2019   dr cam @[facility]    HYDROCELE EXCISION N/A 09/30/2019   Procedure: ERMINIA;  Surgeon: Cam Morene ORN, MD;  Location[facility];  Service: Urology;  Laterality: N/A;     Current Outpatient Medications on File Prior to Visit  Medication Sig Dispense Refill   aspirin 81 MG chewable tablet Chew 81 mg by mouth.     baclofen  (LIORESAL ) 10 MG tablet Take 10 mg by mouth.     lisdexamfetamine (VYVANSE) 50 MG capsule Take 50 mg by mouth daily.     Multiple Vitamin (MULTIVITAMIN) tablet Take 1 tablet by mouth daily.     rosuvastatin (CRESTOR) 10 MG tablet Take 10 mg by mouth daily.     sildenafil (REVATIO) 20 MG tablet TAKE 1 TO 5 TABLETS BY MOUTH ONCE DAILY AS NEEDED      solifenacin (VESICARE) 10 MG tablet Take 10 mg by mouth daily.     tadalafil (CIALIS) 5 MG tablet Take 5 mg by mouth daily.     ALPRAZolam (XANAX) 0.5 MG tablet Take 0.5 mg by mouth daily. (Patient not taking: Reported on 05/05/2024)     buPROPion (WELLBUTRIN) 75 MG tablet Take 75 mg by mouth. (Patient not taking: Reported on 05/05/2024)     chlorhexidine (PERIDEX) 0.12 % solution RINSE WITH 1/2 OZ FOR 30 SECONDS AND EXPECTORATE 3 TIMES DAILY FOR 2 WEEKS (Patient not taking: Reported on 05/05/2024)     Green Tea, Camellia sinensis, (GREEN TEA EXTRACT PO) Take by mouth daily. (Patient not taking: Reported on 05/05/2024)     Nutritional Supplements (LIVER DEFENSE) TABS Take by mouth daily. (Patient not taking: Reported on 05/05/2024)     tirzepatide (ZEPBOUND) 2.5 MG/0.5ML Pen Inject 2.5 mg into the skin. (Patient not taking: Reported on 05/05/2024)     topiramate (TOPAMAX) 50 MG tablet Take 100 mg by mouth every evening. (Patient not taking: Reported on 05/05/2024)     traMADol  (ULTRAM ) 50 MG tablet Take 1-2 tablets (50-100 mg total) by mouth every 6 (six) hours as needed for moderate pain. (Patient not taking: Reported on 05/05/2024) 15 tablet 0   triamcinolone  (KENALOG ) 0.1 % Apply 1 application topically 2 (two) times daily. (Patient not taking: Reported on 05/05/2024) 30 g 0   No current facility-administered medications on file prior to visit.    No Known Allergies  Vitals:   05/05/24 1546  BP: 128/75  Pulse: 72     Physical exam:   General: The patient was alert and appears not in acute distress.  Mood and affect are appropriate .  The patient's interactions are: Cooperative, makes eye contact, follows the instructions and answers questions coherently.  The patient is groomed and appropriately groomed and dressed. Head: Normocephalic, atraumatic.  Neck is supple. Mallampati: 3 .  The neck circumference measured 18.5  inches. Nasal airflow was patent ,    Retrognathia was  noted.  Dental status: biological  Cardiovascular:  Regular rate and cardiac rhythm by palpable pulse. Respiratory: no audible wheezing, no tachypnoea.   Skin:  Without evidence of ankle edema. No discoloration.  Trunk:  BMI is  289 lbs/ 6 '  The patient's posture was leaning to the left - hemiparetic side    Neurologic exam : The patient was awake and alert, oriented to place and time.   Attention span & concentration ability appeared normal.  Speech was fluent, without dysarthria, dysphonia or aphasia, and of normal volume.     Cranial nerves:  There was no loss of smell or taste reported  Pupils are round, equal in size and briskly reactive to light.  Funduscopic exam was deferred.  Extraocular movements in vertical and horizontal planes were intact and without nystagmus. (No Diplopia reported). Visual fields by finger perimetry are intact. Hearing was intact to soft voice.    Facial sensation intact to fine touch.  Facial motor strength: Symmetric movement and tongue and uvula move midline.  Neck ROM: rotation, tilt and flexion extension were intact for age and shoulder shrug was symmetrical.      I would like to thank Corlis Pagan, NP for allowing me to meet with Travis Mullins again    In short, Travis Mullins is presenting with recurrent hypersomnia, fatigue in spite of using his CPAP. He has more breakthrough snoring but had fresh supplies. His weight has ben regained .  Risk factors for OSA were present,  including : Body mass index is 43.4 kg/m., neck size and upper airway anatomy.  He can definitely benefit from weight loss, considering Zepbound.     My Plan is to proceed with:  HST/   prefer Watch pat.   Follow up with in -lab TITRATION  1) we can first perform a HST to see what the current baseline is, but I am sure there is still severe OSA. we can get the patient back after that for an in lab titration and mask refit.   I plan to follow up personally within 4-6 months.    A total time of  45  minutes consistent of a part of face to face encounter , exam and interview,  and additional preparation time for chart review was spent .  At today's visit, we discussed treatment options, associated risk and benefits, and engage in counseling as needed including, but not limited to:  Sleep hygiene, Quality Sleep Habits, and Safety concerns for patients with daytime sleepiness who are warned to not operate machinery/ motor vehicles when drowsy. Risk factors for sleep apnea were identified:  Body mass index is 43.4 kg/m..  Additionally, the following were reviewed: Past medical records, past medical and surgical history, family and social background, as well as relevant laboratory results, imaging findings, and medical notes, where applicable.  This note was generated by myself in part by using dictation software, and as a result, it may contain unintentional typos and errors.  Nevertheless, effort was made to accurately convey the pertinent aspects of the patient's visit.   Dedra Gores, MD  [facility] and [facility] Sleep Board certified in Sleep Medicine by [employer]  of Sleep Medicine and Diplomate of the American Academy of Sleep Medicine (AASM) . Board certified In Neurology, Diplomat of the ABPN,  Fellow of the [employer].

## 2024-05-16 DIAGNOSIS — Z79899 Other long term (current) drug therapy: Secondary | ICD-10-CM | POA: Diagnosis not present

## 2024-05-16 DIAGNOSIS — G8114 Spastic hemiplegia affecting left nondominant side: Secondary | ICD-10-CM | POA: Diagnosis not present

## 2024-05-20 ENCOUNTER — Encounter: Payer: Self-pay | Admitting: Neurology

## 2024-05-26 NOTE — Telephone Encounter (Signed)
 Patient called about having home sleep lab device mailed to home.

## 2024-05-31 DIAGNOSIS — E785 Hyperlipidemia, unspecified: Secondary | ICD-10-CM | POA: Diagnosis not present

## 2024-05-31 DIAGNOSIS — R739 Hyperglycemia, unspecified: Secondary | ICD-10-CM | POA: Diagnosis not present

## 2024-05-31 DIAGNOSIS — I6930 Unspecified sequelae of cerebral infarction: Secondary | ICD-10-CM | POA: Diagnosis not present

## 2024-05-31 DIAGNOSIS — R7303 Prediabetes: Secondary | ICD-10-CM | POA: Diagnosis not present

## 2024-05-31 DIAGNOSIS — N529 Male erectile dysfunction, unspecified: Secondary | ICD-10-CM | POA: Diagnosis not present

## 2024-06-03 ENCOUNTER — Ambulatory Visit: Admitting: Neurology

## 2024-06-03 DIAGNOSIS — I69354 Hemiplegia and hemiparesis following cerebral infarction affecting left non-dominant side: Secondary | ICD-10-CM

## 2024-06-03 DIAGNOSIS — G4733 Obstructive sleep apnea (adult) (pediatric): Secondary | ICD-10-CM

## 2024-06-03 DIAGNOSIS — E6609 Other obesity due to excess calories: Secondary | ICD-10-CM

## 2024-06-07 DIAGNOSIS — Z9989 Dependence on other enabling machines and devices: Secondary | ICD-10-CM | POA: Diagnosis not present

## 2024-06-07 DIAGNOSIS — G8194 Hemiplegia, unspecified affecting left nondominant side: Secondary | ICD-10-CM | POA: Diagnosis not present

## 2024-06-07 DIAGNOSIS — I6930 Unspecified sequelae of cerebral infarction: Secondary | ICD-10-CM | POA: Diagnosis not present

## 2024-06-07 DIAGNOSIS — G4733 Obstructive sleep apnea (adult) (pediatric): Secondary | ICD-10-CM | POA: Diagnosis not present

## 2024-06-15 DIAGNOSIS — G4733 Obstructive sleep apnea (adult) (pediatric): Secondary | ICD-10-CM | POA: Diagnosis not present

## 2024-06-15 DIAGNOSIS — Z6841 Body Mass Index (BMI) 40.0 and over, adult: Secondary | ICD-10-CM | POA: Diagnosis not present

## 2024-06-15 DIAGNOSIS — Z713 Dietary counseling and surveillance: Secondary | ICD-10-CM | POA: Diagnosis not present

## 2024-06-15 NOTE — Progress Notes (Signed)
 "     [facility]  Travis Mullins 43 year old male 13-Dec-1980   HOME SLEEP TEST REPORT ( by Elene  mail -out device )    Study Protocol:     The SANSA chest-worn sensor is an FDA cleared and DOT approved type 4 home sleep test device - it measures eight physiological channels,  including blood oxygen saturation (measured via PPG [photoplethysmography]), EKG-derived heart rate, respiratory effort, chest movement (measured via accelerometer), snoring, body position, and actigraphy.  STUDY DATE:  06-03-2024 Data received : 06-15-2024   ORDERING CLINICIAN:  Dedra Gores, MD  REFERRING CLINICIAN: Izetta Cork , NP    CLINICAL INFORMATION/HISTORY: I have the pleasure of meeting with Travis. Travis Mullins on 05/05/24 > The patient is a 43 y.o. male and wheelchair bound , seen upon a referral by Mallie Izetta, NP for a new Sleep Medicine Consultation.  The patient's referral information stated the patient has non restorative sleep on CPAP,  but CPAP machine is now 7 years, he feels it works but reports that he can't sleep supine - he has airway obstruction and chokes , and his risak factors for sleep apnea are plenty: hemiparesis after CVA, BMI , airway anatomy.   Chief concern according to patient: Travis Mullins had seen me in 2018 , had a stroke at age 3 while playing football,  caused by dissection of the right carotid artery.  He had sleep apnea probably long before that, he was always feeling unrefreshed and drowsy in the morning. Sleep study in 2018 : AHI 53/h - and he was excessively daytime sleepy.    Sleep relevant medical/ surgical and symptom history: The patient reports onset of symptoms over a time period of the last 3-4 years again, in spite of compliant CPAP use . Sleepiness, fatigue, break through snoring, non restorative sleep  See ROS. Retrognathia, Obesity, hemiparesis,   Trauma such as TBI/ whiplash  2001, thrombosis. Prediabetic, lost 81 pounds in 2021, gained it all back. Mood  disorders, Depression.   Travis Mullins is presenting with recurrent hypersomnia, fatigue in spite of using his CPAP. He has more breakthrough snoring but had fresh supplies. His weight has been regained .  Risk factors for OSA were present,  including : Body mass index is 43.4 kg/m., neck size over 18 and upper airway anatomy.  He can definitely benefit from weight loss, considering Zepbound.      Epworth sleepiness score: 14 / 24 points.    FSS endorsed at 47/ 63 points.   BMI:  43.4 kg/m  Neck Circumference: 18.5   Sleep Summary:   Start of  Recording Time (hours, min):    06-07-2024 at 21:58    Total Sleep Time (hours, min):  5 h 57 minutes   Sleep efficiency %; 76%  REM sleep captured :   2 h 6 minutes                                    Respiratory Indices ( AHI )  by AASM criteria of scoring;  21.2/h                                               Positional  respiratory activity  / snoring : loud snoring was captured. The  device identified supine sleep and right lateral sleep,  supine AHI was 22/h and non supine AHI 18.5/h.    Oxygen Saturation during Sleep:   Oxygen Saturation (%) Mean at 95.% with an  O2 Saturation Range (%) between 80.4 and 100   %                                        O2 Saturation time (minutes) <89%:  less than one minute.          Pulse Rate during Sleep :   Pulse Mean (bpm) was 75 bpm in normal sinus rhythm,   Pulse Range between 58 bpm and 119 bpm.,                 IMPRESSION:  This HST confirms the presence of  moderate- severe obstructive sleep apnea ( AHI 21.2/h) , not associated with hypoxia.  Limited date to compare sleep position impact on apnea and hypopnea index.  Loud snoring .   RECOMMENDATION: I recommend to continue CPAP use, and will write for auto CPAP device by [employer] , set between 5 and 12 cm water, 3 cm EPR, mask of choice . (The current CPAP machine's pressure is suprisingly  low set at 6 cm water with 3 cm EPR). The patient  experiences a lot of air leaks which will require a new mask fitting. I hope it will be possible to sleep  supine with these higher pressures.  Weight loss is another main recommendation and Travis Thoma would qualify for the FDA criteria of Zepbound use, ( OSA moderate and BMI over 35)  but his insurance may not cover the medication.      Any Patient endorsing a high level of sleepiness should be cautioned not to drive, work at heights, or operate dangerous machinery or heavy equipment when tired or sleepy.  Sleep is more refreshing and restorative when routines are established, bed and rise time, no screen time, no TV in the bedroom.  Review of good sleep hygiene measures took place in the initial consultation but should be revisited ( Your guide to better sleep  a publication by the NIH is a good source of information).   The referring provider will be notified of the test results.    I certify that I have reviewed the raw data recording prior to the issuance of this report in accordance with the standards of the American Academy of Sleep Medicine (AASM).    INTERPRETING PHYSICIAN:    Dedra Gores, MD  [facility] and [facility] Sleep Board certified by The [employer] of Sleep Medicine and Diplomate of the [employer] Medicine. Board certified In Neurology through the ABPN, Fellow of the [employer].               "

## 2024-06-24 ENCOUNTER — Ambulatory Visit: Payer: Self-pay | Admitting: Neurology

## 2024-06-24 DIAGNOSIS — G4733 Obstructive sleep apnea (adult) (pediatric): Secondary | ICD-10-CM | POA: Insufficient documentation

## 2024-06-24 DIAGNOSIS — I69354 Hemiplegia and hemiparesis following cerebral infarction affecting left non-dominant side: Secondary | ICD-10-CM | POA: Insufficient documentation

## 2024-06-24 DIAGNOSIS — R0683 Snoring: Secondary | ICD-10-CM

## 2024-06-24 DIAGNOSIS — E6609 Other obesity due to excess calories: Secondary | ICD-10-CM

## 2024-06-24 DIAGNOSIS — F519 Sleep disorder not due to a substance or known physiological condition, unspecified: Secondary | ICD-10-CM

## 2024-06-24 NOTE — Procedures (Signed)
 "     [facility]  Travis Mullins 44 year old male 13-Dec-1980   HOME SLEEP TEST REPORT ( by Elene  mail -out device )    Study Protocol:     The SANSA chest-worn sensor is an FDA cleared and DOT approved type 4 home sleep test device - it measures eight physiological channels,  including blood oxygen saturation (measured via PPG [photoplethysmography]), EKG-derived heart rate, respiratory effort, chest movement (measured via accelerometer), snoring, body position, and actigraphy.  STUDY DATE:  06-03-2024 Data received : 06-15-2024   ORDERING CLINICIAN:  Dedra Gores, MD  REFERRING CLINICIAN: Izetta Cork , NP    CLINICAL INFORMATION/HISTORY: I have the pleasure of meeting with Travis Mullins on 05/05/24 > The patient is a 44 y.o. male and wheelchair bound , seen upon a referral by Mallie Izetta, NP for a new Sleep Medicine Consultation.  The patient's referral information stated the patient has non restorative sleep on CPAP,  but CPAP machine is now 7 years, he feels it works but reports that he can't sleep supine - he has airway obstruction and chokes , and his risak factors for sleep apnea are plenty: hemiparesis after CVA, BMI , airway anatomy.   Chief concern according to patient: [name] had seen me in 2018 , had a stroke at age 3 while playing football,  caused by dissection of the right carotid artery.  He had sleep apnea probably long before that, he was always feeling unrefreshed and drowsy in the morning. Sleep study in 2018 : AHI 53/h - and he was excessively daytime sleepy.    Sleep relevant medical/ surgical and symptom history: The patient reports onset of symptoms over a time period of the last 3-4 years again, in spite of compliant CPAP use . Sleepiness, fatigue, break through snoring, non restorative sleep  See ROS. Retrognathia, Obesity, hemiparesis,   Trauma such as TBI/ whiplash  2001, thrombosis. Prediabetic, lost 81 pounds in 2021, gained it all back. Mood  disorders, Depression.   [name] is presenting with recurrent hypersomnia, fatigue in spite of using his CPAP. He has more breakthrough snoring but had fresh supplies. His weight has been regained .  Risk factors for OSA were present,  including : Body mass index is 43.4 kg/m., neck size over 18 and upper airway anatomy.  He can definitely benefit from weight loss, considering Zepbound.      Epworth sleepiness score: 14 / 24 points.    FSS endorsed at 47/ 63 points.   BMI:  43.4 kg/m  Neck Circumference: 18.5   Sleep Summary:   Start of  Recording Time (hours, min):    06-07-2024 at 21:58    Total Sleep Time (hours, min):  5 h 57 minutes   Sleep efficiency %; 76%  REM sleep captured :   2 h 6 minutes                                    Respiratory Indices ( AHI )  by AASM criteria of scoring;  21.2/h                                               Positional  respiratory activity  / snoring : loud snoring was captured. The  device identified supine sleep and right lateral sleep,  supine AHI was 22/h and non supine AHI 18.5/h.    Oxygen Saturation during Sleep:   Oxygen Saturation (%) Mean at 95.% with an  O2 Saturation Range (%) between 80.4 and 100   %                                        O2 Saturation time (minutes) <89%:  less than one minute.          Pulse Rate during Sleep :   Pulse Mean (bpm) was 75 bpm in normal sinus rhythm,   Pulse Range between 58 bpm and 119 bpm.,                 IMPRESSION:  This HST confirms the presence of  moderate- severe obstructive sleep apnea ( AHI 21.2/h) , not associated with hypoxia.  Limited date to compare sleep position impact on apnea and hypopnea index.  Loud snoring .   RECOMMENDATION: I recommend to continue CPAP use, and will write for auto CPAP device by [employer] , set between 5 and 12 cm water, 3 cm EPR, mask of choice . (The current CPAP machine's pressure is suprisingly  low set at 6 cm water with 3 cm EPR). The patient  experiences a lot of air leaks which will require a new mask fitting. I hope it will be possible to sleep  supine with these higher pressures.  Weight loss is another main recommendation and [name] would qualify for the FDA criteria of Zepbound use, ( OSA moderate and BMI over 35)  but his insurance may not cover the medication.      Any Patient endorsing a high level of sleepiness should be cautioned not to drive, work at heights, or operate dangerous machinery or heavy equipment when tired or sleepy.  Sleep is more refreshing and restorative when routines are established, bed and rise time, no screen time, no TV in the bedroom.  Review of good sleep hygiene measures took place in the initial consultation but should be revisited ( Your guide to better sleep  a publication by the NIH is a good source of information).   The referring provider will be notified of the test results.    I certify that I have reviewed the raw data recording prior to the issuance of this report in accordance with the standards of the American Academy of Sleep Medicine (AASM).    INTERPRETING PHYSICIAN:    Dedra Gores, MD  [facility] Sleep Board certified by The [employer] of Sleep Medicine and Diplomate of the [employer] of Sleep Medicine. Board certified In Neurology through the ABPN, Fellow of the [employer].               "

## 2024-07-01 NOTE — Telephone Encounter (Signed)
 Kirstin Kugler D, CMA  Joylene, Bradley; Ziegler, Melissa; Tucker, Dolanda; Sheree, Stuart; [place], [place] New orders have been placed for the above pt, DOB:August 30, 1980 Thanks

## 2024-07-14 ENCOUNTER — Other Ambulatory Visit
